# Patient Record
Sex: Female | Born: 1950 | Race: White | Hispanic: No | Marital: Married | Smoking: Never smoker
Health system: Southern US, Community
[De-identification: ages and names within clinical notes are randomized; demographics above are authoritative.]

## PROBLEM LIST (undated history)

## (undated) DIAGNOSIS — K649 Unspecified hemorrhoids: Secondary | ICD-10-CM

## (undated) DIAGNOSIS — R112 Nausea with vomiting, unspecified: Secondary | ICD-10-CM

## (undated) DIAGNOSIS — K0530 Chronic periodontitis, unspecified: Secondary | ICD-10-CM

## (undated) DIAGNOSIS — R42 Dizziness and giddiness: Secondary | ICD-10-CM

## (undated) DIAGNOSIS — M1990 Unspecified osteoarthritis, unspecified site: Secondary | ICD-10-CM

## (undated) DIAGNOSIS — L309 Dermatitis, unspecified: Secondary | ICD-10-CM

## (undated) DIAGNOSIS — E785 Hyperlipidemia, unspecified: Secondary | ICD-10-CM

## (undated) DIAGNOSIS — L409 Psoriasis, unspecified: Secondary | ICD-10-CM

## (undated) DIAGNOSIS — Z9889 Other specified postprocedural states: Secondary | ICD-10-CM

## (undated) HISTORY — DX: Unspecified hemorrhoids: K64.9

## (undated) HISTORY — PX: LIPOSUCTION: SHX10

## (undated) HISTORY — PX: COLONOSCOPY: SHX174

## (undated) HISTORY — PX: DILATION AND CURETTAGE OF UTERUS: SHX78

## (undated) HISTORY — PX: HEMORRHOID BANDING: SHX5850

## (undated) HISTORY — PX: OTHER SURGICAL HISTORY: SHX169

## (undated) HISTORY — PX: BREAST SURGERY: SHX581

## (undated) HISTORY — PX: KNEE ARTHROSCOPY W/ LASER: SHX1872

---

## 2007-10-30 ENCOUNTER — Ambulatory Visit (HOSPITAL_BASED_OUTPATIENT_CLINIC_OR_DEPARTMENT_OTHER): Admission: RE | Admit: 2007-10-30 | Discharge: 2007-10-30 | Payer: Self-pay | Admitting: Orthopedic Surgery

## 2008-05-20 ENCOUNTER — Encounter: Admission: RE | Admit: 2008-05-20 | Discharge: 2008-05-20 | Payer: Self-pay | Admitting: Family Medicine

## 2009-07-06 ENCOUNTER — Encounter: Admission: RE | Admit: 2009-07-06 | Discharge: 2009-07-06 | Payer: Self-pay | Admitting: Family Medicine

## 2010-08-01 ENCOUNTER — Encounter: Admission: RE | Admit: 2010-08-01 | Discharge: 2010-08-01 | Payer: Self-pay | Admitting: Family Medicine

## 2011-02-05 NOTE — Op Note (Signed)
NAMEJAZARIA, [name]:  [id number]   MEDICAL RECORD NO.:  [id number]          PATIENT TYPE:  AMB   LOCATION:  [facility]                         FACILITY:  [facility]   PHYSICIAN:  Ollen Gross, M.D.    DATE OF BIRTH:  May 26, 1951   DATE OF PROCEDURE:  10/30/2007  DATE OF DISCHARGE:                               OPERATIVE REPORT   PREOPERATIVE DIAGNOSIS:  Left knee medial meniscal tear.   POSTOPERATIVE DIAGNOSIS:  Left knee medial meniscal tear.   PROCEDURE:  Left knee arthroscopy with medial meniscal debridement.   SURGEON:  Ollen Gross, M.D.   ASSISTANT:  No assistant.   ANESTHESIA:  General.   BLOOD LOSS:  Minimal.   DRAINS:  None.   COMPLICATIONS:  None.   CONDITION:  Stable to recovery.   CLINICAL NOTE:  Lauren Bowman is a 60 year old female who has left knee pain  and mechanical symptoms.  She has had a previous arthroscopy with  meniscal debridement.  She has very similar symptoms now.  Exam and  history suggested recurrent medial tear confirmed by MRI.  She presents  now for arthroscopy with debridement.   PROCEDURE IN DETAIL:  After successful administration of general  anesthetic a tourniquet was placed high on her left thigh and left lower  extremity was prepped and draped in the usual sterile fashion.  Standard  superomedial and inferolateral incisions were made and flow cannula  passed, superomedial camera passed inferolateral.  Arthroscopic  visualization proceeds.  Undersurface of the patella and trochlea looked  normal.  The mediolateral gutters were visualized and there were no  loose bodies.  Flexion and valgus force was applied to the knee and the  medial compartment was entered.  The medial femoral condyle and tibial  plateau looked normal.  There was a little bit of fraying of the free  edge at the medial meniscus and then on the undersurface there was a  tear in the posterior horn.  It was not immediately visible but  examining the  undersurface tear there is a tear.  I then used a spinal  needle to localize the inferomedial portal.  A small incision made,  dilator placed and I was able to probe into the undersurface tear.  I  then debrided the meniscus back to a stable base using the baskets and a  4.2 mm shaver.  It is found to be stable.  The ArthroCare was then used  to seal off the edge of the debrided meniscus.  The rest of the medial  compartment looks normal.  The interchondral notch was visualized and  the ACL was normal.  The lateral compartment was entered and it looks  normal.  I again inspected the rest of the joint and I did not see any  other tears, chondral defects or loose bodies.  Arthroscopic equipment  was removed from the inferior portals which were  closed with interrupted 4-0 nylon.  Twenty mL of 0.25% Marcaine with epi  were injected through the inflow cannula and then that was removed and  that portal closed with nylon.  Bulky sterile  dressing was applied and  she was awakened and transported to recovery in stable condition.      Ollen Gross, M.D.  Electronically Signed     FA/MEDQ  D:  10/30/2007  T:  10/31/2007  Job:  [id number]

## 2011-07-01 ENCOUNTER — Other Ambulatory Visit: Payer: Self-pay | Admitting: Family Medicine

## 2011-07-01 DIAGNOSIS — Z1231 Encounter for screening mammogram for malignant neoplasm of breast: Secondary | ICD-10-CM

## 2011-08-05 ENCOUNTER — Ambulatory Visit: Payer: Self-pay

## 2011-08-12 ENCOUNTER — Ambulatory Visit
Admission: RE | Admit: 2011-08-12 | Discharge: 2011-08-12 | Disposition: A | Discharge status: Home / Self Care | Payer: PRIVATE HEALTH INSURANCE | Source: Ambulatory Visit | Attending: Family Medicine | Admitting: Family Medicine

## 2011-08-12 DIAGNOSIS — Z1231 Encounter for screening mammogram for malignant neoplasm of breast: Secondary | ICD-10-CM

## 2011-08-17 ENCOUNTER — Other Ambulatory Visit: Payer: Self-pay | Admitting: Orthopedic Surgery

## 2011-08-26 ENCOUNTER — Encounter (HOSPITAL_BASED_OUTPATIENT_CLINIC_OR_DEPARTMENT_OTHER): Payer: Self-pay | Admitting: *Deleted

## 2011-08-26 NOTE — Patient Instructions (Signed)
Pt instructed NPO p MN 12/5.  To [facility] 12/6 @ 1030. Needs ekg on arrival.  Will get labs from dr. Dewey"s office. 

## 2011-08-27 NOTE — Progress Notes (Signed)
Pt arrives at 1030. Npo after mn. Needs hg and ekg. RECEIVED CALL BACK FROM DREW PERKINS PA, ORDERS CHANGED ACCORDING TO PREVIOUS STANDING ORDERS.

## 2011-08-28 ENCOUNTER — Encounter (HOSPITAL_BASED_OUTPATIENT_CLINIC_OR_DEPARTMENT_OTHER): Payer: Self-pay | Admitting: Orthopedic Surgery

## 2011-08-28 NOTE — H&P (Signed)
  CC- Lauren Bowman is a 60 y.o. female who presents with left knee pain.  HPI- . Knee Pain: Patient presents with knee pain involving the  left knee. Onset of the symptoms was several months ago. Inciting event: injured while dancing. Current symptoms include giving out, pain located medially and swelling. Pain is aggravated by inactivity, rising after sitting, squatting and walking.  Patient has had prior knee problems. Evaluation to date: plain films: normal. Treatment to date: avoidance of offending activity, corticosteroid injection which was not very effective and rest.  Past Medical History  Diagnosis Date  . No pertinent past medical history   . Eczema   . Psoriasis     Past Surgical History  Procedure Date  . Knee arthroscopy w/ laser '09    w/ debridement  . Dilation and curettage of uterus     x2    Prior to Admission medications   Medication Sig Start Date End Date Taking? Authorizing Provider  atorvastatin (LIPITOR) 10 MG tablet Take 10 mg by mouth daily.     Yes Historical Provider, MD  celecoxib (CELEBREX) 200 MG capsule Take 200 mg by mouth 2 (two) times daily.     Yes Historical Provider, MD  glucosamine-chondroitin 500-400 MG tablet Take 1 tablet by mouth 3 (three) times daily.     Yes Historical Provider, MD  naproxen (NAPROSYN) 500 MG tablet Take 500 mg by mouth 2 (two) times daily with a meal.     Yes Historical Provider, MD  Vitamin D, Ergocalciferol, (DRISDOL) 50000 UNITS CAPS Take 50,000 Units by mouth.     Yes Historical Provider, MD  cholecalciferol (VITAMIN D) 400 UNITS TABS Inject 50,000 Units as directed once a week.     Historical Provider, MD   KNEE EXAM antalgic gait, soft tissue tenderness over medial joint line, negative drawer sign, collateral ligaments intact, negative Lachman sign, normal ipsilateral hip exam  Physical Examination: General appearance - alert, well appearing, and in no distress Mental status - alert, oriented to person, place, and  time Chest - clear to auscultation, no wheezes, rales or rhonchi, symmetric air entry Heart - normal rate, regular rhythm, normal S1, S2, no murmurs, rubs, clicks or gallops Abdomen - soft, nontender, nondistended, no masses or organomegaly Neurological - alert, oriented, normal speech, no focal findings or movement disorder noted   Asessment/Plan--- Left knee chondral defect- - Plan left knee arthroscopy with debridement. Procedure risks and potential comps discussed with patient who elects to proceed. Goals are decreased pain and increased function with a high likelihood of achieving both

## 2011-08-28 NOTE — Progress Notes (Signed)
Pt instructed NPO p MN 12/5.  To [facility] 12/6 @ 1030. Needs ekg on arrival.  Will get labs from dr. Chauncy Passy office.

## 2011-08-29 ENCOUNTER — Encounter (HOSPITAL_BASED_OUTPATIENT_CLINIC_OR_DEPARTMENT_OTHER): Payer: Self-pay | Admitting: Anesthesiology

## 2011-08-29 ENCOUNTER — Encounter (HOSPITAL_BASED_OUTPATIENT_CLINIC_OR_DEPARTMENT_OTHER): Payer: Self-pay | Admitting: *Deleted

## 2011-08-29 ENCOUNTER — Ambulatory Visit (HOSPITAL_BASED_OUTPATIENT_CLINIC_OR_DEPARTMENT_OTHER)
Admission: RE | Admit: 2011-08-29 | Discharge: 2011-08-29 | Disposition: A | Discharge status: Home / Self Care | Payer: PRIVATE HEALTH INSURANCE | Source: Ambulatory Visit | Attending: Orthopedic Surgery | Admitting: Orthopedic Surgery

## 2011-08-29 ENCOUNTER — Encounter (HOSPITAL_BASED_OUTPATIENT_CLINIC_OR_DEPARTMENT_OTHER): Payer: Self-pay | Admitting: Orthopedic Surgery

## 2011-08-29 ENCOUNTER — Ambulatory Visit (HOSPITAL_BASED_OUTPATIENT_CLINIC_OR_DEPARTMENT_OTHER): Payer: PRIVATE HEALTH INSURANCE | Admitting: Anesthesiology

## 2011-08-29 ENCOUNTER — Encounter (HOSPITAL_BASED_OUTPATIENT_CLINIC_OR_DEPARTMENT_OTHER)
Admission: RE | Disposition: A | Discharge status: Home / Self Care | Payer: Self-pay | Source: Ambulatory Visit | Attending: Orthopedic Surgery

## 2011-08-29 DIAGNOSIS — Z79899 Other long term (current) drug therapy: Secondary | ICD-10-CM | POA: Insufficient documentation

## 2011-08-29 DIAGNOSIS — M2390 Unspecified internal derangement of unspecified knee: Secondary | ICD-10-CM | POA: Insufficient documentation

## 2011-08-29 DIAGNOSIS — M25562 Pain in left knee: Secondary | ICD-10-CM

## 2011-08-29 DIAGNOSIS — M23329 Other meniscus derangements, posterior horn of medial meniscus, unspecified knee: Secondary | ICD-10-CM | POA: Insufficient documentation

## 2011-08-29 HISTORY — DX: Dermatitis, unspecified: L30.9

## 2011-08-29 HISTORY — PX: CHONDROPLASTY: SHX5177

## 2011-08-29 HISTORY — DX: Psoriasis, unspecified: L40.9

## 2011-08-29 SURGERY — ARTHROSCOPY, KNEE, WITH MEDIAL MENISCECTOMY
Anesthesia: General | Site: Knee | Laterality: Left | Wound class: Clean

## 2011-08-29 MED ORDER — DEXAMETHASONE SODIUM PHOSPHATE 4 MG/ML IJ SOLN
INTRAMUSCULAR | Status: DC | PRN
  Administered 2011-08-29: 4 mg via INTRAVENOUS

## 2011-08-29 MED ORDER — CHLORHEXIDINE GLUCONATE 4 % EX LIQD: 60.0000 mL | Freq: Once | CUTANEOUS | Status: DC

## 2011-08-29 MED ORDER — METOCLOPRAMIDE HCL 5 MG/ML IJ SOLN
INTRAMUSCULAR | Status: DC | PRN
  Administered 2011-08-29: 10 mg via INTRAVENOUS

## 2011-08-29 MED ORDER — KETOROLAC TROMETHAMINE 30 MG/ML IJ SOLN: 15.0000 mg | Freq: Once | INTRAMUSCULAR | Status: DC | PRN

## 2011-08-29 MED ORDER — CEFAZOLIN SODIUM 1-5 GM-% IV SOLN
INTRAVENOUS | Status: DC | PRN
  Administered 2011-08-29: 1 g via INTRAVENOUS

## 2011-08-29 MED ORDER — ONDANSETRON HCL 4 MG/2ML IJ SOLN
INTRAMUSCULAR | Status: DC | PRN
  Administered 2011-08-29: 4 mg via INTRAVENOUS

## 2011-08-29 MED ORDER — CEFAZOLIN SODIUM 1-5 GM-% IV SOLN: 1.0000 g | Freq: Once | INTRAVENOUS | Status: DC

## 2011-08-29 MED ORDER — OXYCODONE-ACETAMINOPHEN 5-325 MG PO TABS
1.0000 | ORAL_TABLET | Freq: Once | ORAL | Status: AC
  Administered 2011-08-29: 1 via ORAL

## 2011-08-29 MED ORDER — LIDOCAINE HCL (CARDIAC) 20 MG/ML IV SOLN
INTRAVENOUS | Status: DC | PRN
  Administered 2011-08-29: 100 mg via INTRAVENOUS

## 2011-08-29 MED ORDER — FENTANYL CITRATE 0.05 MG/ML IJ SOLN: 25.0000 ug | INTRAMUSCULAR | Status: DC | PRN

## 2011-08-29 MED ORDER — LACTATED RINGERS IV SOLN
INTRAVENOUS | Status: DC | PRN
  Administered 2011-08-29 (×2): via INTRAVENOUS

## 2011-08-29 MED ORDER — KETOROLAC TROMETHAMINE 30 MG/ML IJ SOLN
INTRAMUSCULAR | Status: DC | PRN
  Administered 2011-08-29: 30 mg via INTRAVENOUS

## 2011-08-29 MED ORDER — PROPOFOL 10 MG/ML IV EMUL
INTRAVENOUS | Status: DC | PRN
  Administered 2011-08-29: 180 mg via INTRAVENOUS

## 2011-08-29 MED ORDER — SODIUM CHLORIDE 0.9 % IR SOLN
Status: DC | PRN
  Administered 2011-08-29: 6000 mL

## 2011-08-29 MED ORDER — FENTANYL CITRATE 0.05 MG/ML IJ SOLN
INTRAMUSCULAR | Status: DC | PRN
  Administered 2011-08-29: 25 ug via INTRAVENOUS
  Administered 2011-08-29: 100 ug via INTRAVENOUS
  Administered 2011-08-29 (×3): 25 ug via INTRAVENOUS

## 2011-08-29 MED ORDER — BUPIVACAINE-EPINEPHRINE 0.25% -1:200000 IJ SOLN
INTRAMUSCULAR | Status: DC | PRN
  Administered 2011-08-29: 20 mL

## 2011-08-29 MED ORDER — METHOCARBAMOL 500 MG PO TABS
500.0000 mg | ORAL_TABLET | Freq: Four times a day (QID) | ORAL | Status: AC | PRN
  Administered 2011-08-29: 500 mg via ORAL

## 2011-08-29 MED ORDER — MIDAZOLAM HCL 5 MG/5ML IJ SOLN
INTRAMUSCULAR | Status: DC | PRN
  Administered 2011-08-29: 2 mg via INTRAVENOUS

## 2011-08-29 MED ORDER — OXYCODONE-ACETAMINOPHEN 5-325 MG PO TABS: 1.0000 | ORAL_TABLET | ORAL | Status: AC | PRN

## 2011-08-29 MED ORDER — LACTATED RINGERS IV SOLN
INTRAVENOUS | Status: DC
  Administered 2011-08-29: 12:00:00 via INTRAVENOUS

## 2011-08-29 MED ORDER — METHOCARBAMOL 500 MG PO TABS: 500.0000 mg | ORAL_TABLET | Freq: Four times a day (QID) | ORAL | Status: AC

## 2011-08-29 MED ORDER — DROPERIDOL 2.5 MG/ML IJ SOLN
INTRAMUSCULAR | Status: DC | PRN
  Administered 2011-08-29: 0.625 mg via INTRAVENOUS

## 2011-08-29 SURGICAL SUPPLY — 35 items
BANDAGE ELASTIC 6 VELCRO ST LF (GAUZE/BANDAGES/DRESSINGS) ×3 IMPLANT
BLADE 4.2CUDA (BLADE) ×3 IMPLANT
BLADE CUDA SHAVER 3.5 (BLADE) IMPLANT
BLADE CUTTER GATOR 3.5 (BLADE) IMPLANT
CANISTER SUCT LVC 12 LTR MEDI- (MISCELLANEOUS) ×3 IMPLANT
CANISTER SUCTION 2500CC (MISCELLANEOUS) IMPLANT
CLOTH BEACON ORANGE TIMEOUT ST (SAFETY) ×3 IMPLANT
DRAPE ARTHROSCOPY W/POUCH 114 (DRAPES) ×3 IMPLANT
DRSG EMULSION OIL 3X3 NADH (GAUZE/BANDAGES/DRESSINGS) ×3 IMPLANT
DRSG PAD ABDOMINAL 8X10 ST (GAUZE/BANDAGES/DRESSINGS) ×3 IMPLANT
DURAPREP 26ML APPLICATOR (WOUND CARE) ×3 IMPLANT
ELECT MENISCUS 165MM 90D (ELECTRODE) IMPLANT
ELECT REM PT RETURN 9FT ADLT (ELECTROSURGICAL)
ELECTRODE REM PT RTRN 9FT ADLT (ELECTROSURGICAL) IMPLANT
GLOVE BIO SURGEON STRL SZ8 (GLOVE) ×3 IMPLANT
GLOVE INDICATOR 6.5 STRL GRN (GLOVE) ×6 IMPLANT
GLOVE INDICATOR 8.0 STRL GRN (GLOVE) ×6 IMPLANT
GOWN PREVENTION PLUS LG XLONG (DISPOSABLE) ×6 IMPLANT
IV NS IRRIG 3000ML ARTHROMATIC (IV SOLUTION) ×6 IMPLANT
KNEE WRAP E Z 3 GEL PACK (MISCELLANEOUS) ×3 IMPLANT
PACK ARTHROSCOPY DSU (CUSTOM PROCEDURE TRAY) ×3 IMPLANT
PACK BASIN DAY SURGERY FS (CUSTOM PROCEDURE TRAY) ×3 IMPLANT
PADDING CAST ABS 4INX4YD NS (CAST SUPPLIES) ×1
PADDING CAST ABS COTTON 4X4 ST (CAST SUPPLIES) ×2 IMPLANT
PADDING CAST COTTON 6X4 STRL (CAST SUPPLIES) ×3 IMPLANT
PADDING WEBRIL 4 STERILE (GAUZE/BANDAGES/DRESSINGS) ×3 IMPLANT
PENCIL BUTTON HOLSTER BLD 10FT (ELECTRODE) IMPLANT
SET ARTHROSCOPY TUBING (MISCELLANEOUS) ×1
SET ARTHROSCOPY TUBING LN (MISCELLANEOUS) ×2 IMPLANT
SPONGE GAUZE 4X4 12PLY (GAUZE/BANDAGES/DRESSINGS) ×3 IMPLANT
SUT ETHILON 4 0 PS 2 18 (SUTURE) ×3 IMPLANT
TOWEL OR 17X24 6PK STRL BLUE (TOWEL DISPOSABLE) ×3 IMPLANT
WAND 30 DEG SABER W/CORD (SURGICAL WAND) IMPLANT
WAND 90 DEG TURBOVAC W/CORD (SURGICAL WAND) ×3 IMPLANT
WATER STERILE IRR 500ML POUR (IV SOLUTION) ×3 IMPLANT

## 2011-08-29 NOTE — Anesthesia Procedure Notes (Addendum)
Procedure Name: LMA Insertion Date/Time: 08/29/2011 1:14 PM Performed by: Iline Oven Pre-anesthesia Checklist: Patient identified, Emergency Drugs available, Suction available and Patient being monitored Patient Re-evaluated:Patient Re-evaluated prior to inductionOxygen Delivery Method: Circle System Utilized Preoxygenation: Pre-oxygenation with 100% oxygen Intubation Type: IV induction Ventilation: Mask ventilation without difficulty LMA: LMA inserted LMA Size: 4.0 Number of attempts: 1 Airway Equipment and Method: bite block Placement Confirmation: positive ETCO2 Tube secured with: Tape Dental Injury: Teeth and Oropharynx as per pre-operative assessment

## 2011-08-29 NOTE — Anesthesia Preprocedure Evaluation (Addendum)
Anesthesia Evaluation  Patient identified by MRN, date of birth, ID band Patient awake    Reviewed: Allergy & Precautions, H&P , NPO status , Patient's Chart, lab work & pertinent test results  Airway Mallampati: II TM Distance: >3 FB Neck ROM: Full  Mouth opening: Limited Mouth Opening  Dental No notable dental hx.    Pulmonary neg pulmonary ROS,  clear to auscultation  Pulmonary exam normal       Cardiovascular neg cardio ROS Regular Normal    Neuro/Psych Negative Neurological ROS  Negative Psych ROS   GI/Hepatic negative GI ROS, Neg liver ROS,   Endo/Other  Negative Endocrine ROS  Renal/GU negative Renal ROS  Genitourinary negative   Musculoskeletal negative musculoskeletal ROS (+)   Abdominal   Peds negative pediatric ROS (+)  Hematology negative hematology ROS (+)   Anesthesia Other Findings   Reproductive/Obstetrics negative OB ROS                          Anesthesia Physical Anesthesia Plan  ASA: I  Anesthesia Plan: General   Post-op Pain Management:    Induction:   Airway Management Planned: LMA  Additional Equipment:   Intra-op Plan:   Post-operative Plan:   Informed Consent: I have reviewed the patients History and Physical, chart, labs and discussed the procedure including the risks, benefits and alternatives for the proposed anesthesia with the patient or authorized representative who has indicated his/her understanding and acceptance.   Dental advisory given  Plan Discussed with: CRNA  Anesthesia Plan Comments:         Anesthesia Quick Evaluation

## 2011-08-29 NOTE — Transfer of Care (Signed)
Immediate Anesthesia Transfer of Care Note  Patient: Lauren Bowman  Procedure(s) Performed:  KNEE ARTHROSCOPY WITH MEDIAL MENISECTOMY; CHONDROPLASTY  Patient Location: PACU  Anesthesia Type: General  Level of Consciousness: awake, sedated, patient cooperative and responds to stimulation  Airway & Oxygen Therapy: Patient Spontanous Breathing and Patient connected to face mask oxygen  Post-op Assessment: Report given to PACU RN, Post -op Vital signs reviewed and stable and Patient moving all extremities  Post vital signs: Reviewed and stable  Complications: No apparent anesthesia complications

## 2011-08-29 NOTE — Op Note (Signed)
Preoperative diagnosis-  Left knee medial meniscal chondral defect  Postoperative diagnosis Left- knee medial meniscal tear   Plus Left chondral defect medially  Procedure- Left knee arthroscopy with medial  Meniscal debridement and chondroplasty  Surgeon- Gus Rankin. Fathima Bartl, MD  Anesthesia-General  EBL-  minimal Complications- None  Condition- PACU - hemodynamically stable.  Brief clinical note- -Lauren Bowman is a 60 y.o.  female with a long history of left knee pain and mechanical symptoms. She has had previous arthroscopy and has significant pain with weight bearing and has failed injections of cortisone and viscosupplements. X-rays show no evidence of bone on bone OA. The patient presents now for arthroscopy and debridement due to failure of non-operative management.  Procedure in detail -       After successful administration of General anesthetic, a tourmiquet is placed high on the Left  thigh and the Left lower extremity is prepped and draped in the usual sterile fashion. Time out is performed by the surgical team. Standard superomedial and inferolateral portal sites are marked and incisions made with an 11 blade. The inflow cannula is passed through the superomedial portal and camera through the inferolateral portal and inflow is initiated. Arthroscopic visualization proceeds.      The undersurface of the patella and trochlea are visualized and they appear normal without evidence of cartilage lesions. The medial and lateral gutters are visualized and there are no loose bodies. Flexion and valgus force is applied to the knee and the medial compartment is entered. A spinal needle is passed into the joint through the site marked for the inferomedial portal. A small incision is made and the dilator passed into the joint. The findings for the medial compartment are bone on bone changes through over 50% of the medial compartment with a degenerative posterior horn medial meniscal tear and  unstable articular cartilage on the medial femoral condyle. The tear is debrided to a stable base with baskets and a shaver and sealed off with the Arthrocare. The shaver is used to debride the unstable cartilage to a stable bony base with stable edges. It is probed and found to be stable.    The intercondylar notch is visualized and the ACL appears normal. The lateral compartment is entered and the findings are normal lateral compartment .      The joint is again inspected and there are no other tears, defects or loose bodies identified. The arthroscopic equipment is then removed from the inferior portals which are closed with interrupted 4-0 nylon. 20 ml of .25% Marcaine with epinephrine are injected through the inflow cannula and the cannula is then removed and the portal closed with nylon. The incisions are cleaned and dried and a bulky sterile dressing is applied. The patient is then awakened and transported to recovery in stable condition.   08/29/2011, 1:45 PM

## 2011-08-29 NOTE — Interval H&P Note (Signed)
History and Physical Interval Note:  08/29/2011 1:01 PM  Lauren Bowman  has presented today for surgery, with the diagnosis of left knee chondral defect  The various methods of treatment have been discussed with the patient and family. After consideration of risks, benefits and other options for treatment, the patient has consented to  Procedure(s): ARTHROSCOPY KNEE as a surgical intervention .  The patients' history has been reviewed, patient examined, no change in status, stable for surgery.  I have reviewed the patients' chart and labs.  Questions were answered to the patient's satisfaction.     Loanne Drilling

## 2011-08-29 NOTE — Anesthesia Postprocedure Evaluation (Signed)
  Anesthesia Post-op Note  Patient: Lauren Bowman  Procedure(s) Performed:  KNEE ARTHROSCOPY WITH MEDIAL MENISECTOMY; CHONDROPLASTY  Patient Location: PACU  Anesthesia Type: General  Level of Consciousness: awake and alert   Airway and Oxygen Therapy: Patient Spontanous Breathing  Post-op Pain: mild  Post-op Assessment: Post-op Vital signs reviewed, Patient's Cardiovascular Status Stable, Respiratory Function Stable, Patent Airway and No signs of Nausea or vomiting  Post-op Vital Signs: stable  Complications: No apparent anesthesia complications

## 2011-09-02 ENCOUNTER — Encounter (HOSPITAL_BASED_OUTPATIENT_CLINIC_OR_DEPARTMENT_OTHER): Payer: Self-pay | Admitting: Orthopedic Surgery

## 2011-11-13 NOTE — Progress Notes (Signed)
H&P Dictated 11/13/2011/ Dictation # [id number]

## 2011-11-14 ENCOUNTER — Encounter (HOSPITAL_COMMUNITY): Payer: Self-pay

## 2011-11-14 ENCOUNTER — Encounter (HOSPITAL_COMMUNITY)
Admission: RE | Admit: 2011-11-14 | Discharge: 2011-11-14 | Disposition: A | Discharge status: Home / Self Care | Payer: PRIVATE HEALTH INSURANCE | Source: Ambulatory Visit | Attending: Orthopedic Surgery | Admitting: Orthopedic Surgery

## 2011-11-14 ENCOUNTER — Encounter (HOSPITAL_COMMUNITY): Payer: Self-pay | Admitting: Pharmacy Technician

## 2011-11-14 HISTORY — DX: Other specified postprocedural states: Z98.890

## 2011-11-14 HISTORY — DX: Hyperlipidemia, unspecified: E78.5

## 2011-11-14 HISTORY — DX: Unspecified osteoarthritis, unspecified site: M19.90

## 2011-11-14 HISTORY — DX: Chronic periodontitis, unspecified: K05.30

## 2011-11-14 HISTORY — DX: Dizziness and giddiness: R42

## 2011-11-14 HISTORY — DX: Other specified postprocedural states: R11.2

## 2011-11-14 LAB — URINALYSIS, ROUTINE W REFLEX MICROSCOPIC
Glucose, UA: NEGATIVE mg/dL
Ketones, ur: NEGATIVE mg/dL
Leukocytes, UA: NEGATIVE
Nitrite: NEGATIVE
Specific Gravity, Urine: 1.028 (ref 1.005–1.030)
pH: 6 (ref 5.0–8.0)

## 2011-11-14 LAB — CBC
Platelets: 253 10*3/uL (ref 150–400)
RBC: 4.55 MIL/uL (ref 3.87–5.11)
RDW: 13.6 % (ref 11.5–15.5)
WBC: 7.5 10*3/uL (ref 4.0–10.5)

## 2011-11-14 LAB — DIFFERENTIAL
Basophils Relative: 1 % (ref 0–1)
Lymphs Abs: 2.8 10*3/uL (ref 0.7–4.0)
Monocytes Absolute: 0.5 10*3/uL (ref 0.1–1.0)
Monocytes Relative: 7 % (ref 3–12)
Neutro Abs: 3.8 10*3/uL (ref 1.7–7.7)
Neutrophils Relative %: 50 % (ref 43–77)

## 2011-11-14 LAB — BASIC METABOLIC PANEL
Calcium: 9.2 mg/dL (ref 8.4–10.5)
GFR calc Af Amer: >90 mL/min (ref >90)
GFR calc non Af Amer: >90 mL/min (ref >90)
Sodium: 140 mEq/L (ref 135–145)

## 2011-11-14 LAB — SURGICAL PCR SCREEN
MRSA, PCR: NEGATIVE
Staphylococcus aureus: NEGATIVE

## 2011-11-14 LAB — PROTIME-INR: INR: 0.86 (ref 0.00–1.49)

## 2011-11-14 LAB — APTT: aPTT: 29 seconds (ref 24–37)

## 2011-11-14 MED ORDER — CHLORHEXIDINE GLUCONATE 4 % EX LIQD
60.0000 mL | Freq: Once | CUTANEOUS | Status: DC
  Filled 2011-11-14: qty 60

## 2011-11-14 NOTE — Pre-Procedure Instructions (Signed)
Chest x ray and ekg not done today at PST visit as did not meet criteria

## 2011-11-14 NOTE — H&P (Signed)
NAMECADINCE, HILSCHER NO.:  [id number]  MEDICAL RECORD NO.:  [id number]  LOCATION:                               FACILITY:  [facility]  PHYSICIAN:  Madlyn Frankel. [name], M.D.  DATE OF BIRTH:  15-Dec-1950  DATE OF ADMISSION:  11/19/2011 DATE OF DISCHARGE:                             HISTORY & PHYSICAL   DATE OF SURGERY:  November 19, 2011.  ADMITTING DIAGNOSIS:  Medial compartment arthritis, left knee.  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  At this time, she is now scheduled for unicompartmental arthroplasty of her left knee.  The surgery, risks, benefits, and aftercare were discussed in detail with the patient, questions invited and answered.  Note that she is not a candidate for tranexamic acid or dexamethasone and will not receive that at surgery. She is planning on going home after surgery.  Her medical doctor is Dr. Duanne Guess.  Note that she is on Stelara for her psoriasis.  We did call over and speak to her dermatologist's nurse and in-charge of the Stelara and she is okay to proceed with surgery, though as discussed with the patient, any of the biologics do put her at slight increased risk of infection, and she will need to watch her wound closely after surgery. She is given her home medications of aspirin, iron, MiraLax, Colace, and Robaxin.  DRUG ALLERGIES:  Nitrogen mustard.  CURRENT MEDICATIONS: 1. Cosamin ASU. 2. Celebrex 200 mg daily. 3. Stelara 1 shot every 3 months. 4. Nitrofurantoin. 5. Pyridium p.r.n.  SERIOUS MEDICAL ILLNESSES:  Hypercholesterolemia, psoriasis, history of basal cell carcinoma on her face.  PREVIOUS SURGERIES:  Left knee scope x3, childbirth, and D and C x3.  FAMILY HISTORY:  Positive for lymphoma, CVA, diabetes, coronary artery disease, and  testicular cancer.  SOCIAL HISTORY:  The patient is married.  She does not smoke and drinks occasionally.  REVIEW OF SYSTEMS:  CENTRAL NERVOUS SYSTEM:  Negative for headache, blurred vision, or dizziness.  PULMONARY:  Negative shortness of breath, PND, or orthopnea.  CARDIOVASCULAR:  Negative chest pain or palpitation. GASTROINTESTINAL:  Negative for ulcers or hepatitis.  GENITOURINARY: Positive for history of intermittent UTI.  MUSCULOSKELETAL:  Positive as in HPI.  PHYSICAL EXAMINATION:  VITAL SIGNS:  Blood pressure 147/70, pulse 72 and regular, respirations 14. HEENT:  Head, normocephalic.  Nose, patent.  Ears, patent.  Pupils equal, round, and reactive to light.  Throat, without injection. NECK:  Supple without adenopathy.  Carotids 2+ without bruit. CHEST:  Clear to auscultation.  No rales or rhonchi.  Respirations 14. HEART:  Regular rate and rhythm at 72 beats per minute without murmur. ABDOMEN:  Soft.  Active bowel sounds.  No masses or organomegaly. NEUROLOGIC:  The patient is alert and  oriented to time, place, and person.  Cranial nerves II through XII grossly intact. EXTREMITIES:  Left knee with full extension, further flexion to 130 degrees.  Tenderness in the medial joint line.  Neurovascular status is intact.  Dorsalis pedis, posterior tibialis pulses are 2+.  IMPRESSION:  Medial compartment osteoarthritis left knee.  PLAN:  Unicompartmental arthroplasty, left knee.     Jaquelyn Bitter. Geraldine Sandberg, P.A.   ______________________________ Madlyn Frankel [name], M.D.    SJC/MEDQ  D:  11/13/2011  T:  11/14/2011  Job:  [id number]

## 2011-11-14 NOTE — Patient Instructions (Addendum)
20 [name]  11/14/2011   Your procedure is scheduled on:  11/19/11  Surgery 1030-1210   TUESDAY  Report to [facility] at   0800    AM.  Call this number if you have problems the morning of surgery: [phone]     Or PST   [id number]  [facility]   Remember:   Do not eat food:After Midnight.   Monday NIGHT  May have clear liquids until midnight: Monday NIGHT  Clear liquids include soda, tea, black coffee, apple or grape juice, broth.  Take these medicines the morning of surgery with A SIP OF WATER:none   Do not wear jewelry, make-up or nail polish.  Do not wear lotions, powders, or perfumes. You may wear deodorant.  Do not shave 48 hours prior to surgery.  Do not bring valuables to the hospital.  Contacts, dentures or bridgework may not be worn into surgery.  Leave suitcase in the car. After surgery it may be brought to your room.  For patients admitted to the hospital, checkout time is 11:00 AM the day of discharge.   Patients discharged the day of surgery will not be allowed to drive home.  Name and phone number of your driver:      Husband                                                                Special Instructions: CHG Shower Use Special Wash: 1/2 bottle night before surgery and 1/2 bottle morning of surgery. REGULAR SOAP FACE AND PRIVATES              LADIES- NO SHAVING 48 HOURS BEFORE USING BETASEPT SOAP.                   Please read over the following fact sheets that you were given: MRSA Information

## 2011-11-19 ENCOUNTER — Encounter (HOSPITAL_COMMUNITY)
Admission: RE | Disposition: A | Discharge status: Home / Self Care | Payer: Self-pay | Source: Ambulatory Visit | Attending: Orthopedic Surgery

## 2011-11-19 ENCOUNTER — Encounter (HOSPITAL_COMMUNITY): Payer: Self-pay | Admitting: Anesthesiology

## 2011-11-19 ENCOUNTER — Ambulatory Visit (HOSPITAL_COMMUNITY)
Admission: RE | Admit: 2011-11-19 | Discharge: 2011-11-20 | Disposition: A | Discharge status: Home / Self Care | Payer: PRIVATE HEALTH INSURANCE | Source: Ambulatory Visit | Attending: Orthopedic Surgery | Admitting: Orthopedic Surgery

## 2011-11-19 ENCOUNTER — Encounter (HOSPITAL_COMMUNITY): Payer: Self-pay | Admitting: *Deleted

## 2011-11-19 ENCOUNTER — Inpatient Hospital Stay (HOSPITAL_COMMUNITY): Payer: PRIVATE HEALTH INSURANCE | Admitting: Anesthesiology

## 2011-11-19 DIAGNOSIS — L408 Other psoriasis: Secondary | ICD-10-CM | POA: Insufficient documentation

## 2011-11-19 DIAGNOSIS — Z85828 Personal history of other malignant neoplasm of skin: Secondary | ICD-10-CM | POA: Insufficient documentation

## 2011-11-19 DIAGNOSIS — E7800 Pure hypercholesterolemia, unspecified: Secondary | ICD-10-CM | POA: Insufficient documentation

## 2011-11-19 DIAGNOSIS — Z01812 Encounter for preprocedural laboratory examination: Secondary | ICD-10-CM | POA: Insufficient documentation

## 2011-11-19 DIAGNOSIS — Z96652 Presence of left artificial knee joint: Secondary | ICD-10-CM

## 2011-11-19 DIAGNOSIS — M1710 Unilateral primary osteoarthritis, unspecified knee: Principal | ICD-10-CM | POA: Insufficient documentation

## 2011-11-19 DIAGNOSIS — Z79899 Other long term (current) drug therapy: Secondary | ICD-10-CM | POA: Insufficient documentation

## 2011-11-19 HISTORY — PX: PARTIAL KNEE ARTHROPLASTY: SHX2174

## 2011-11-19 LAB — TYPE AND SCREEN
ABO/RH(D): A NEG
Antibody Screen: NEGATIVE

## 2011-11-19 SURGERY — ARTHROPLASTY, KNEE, UNICOMPARTMENTAL
Anesthesia: Spinal | Site: Knee | Laterality: Left | Wound class: Clean

## 2011-11-19 MED ORDER — POLYETHYLENE GLYCOL 3350 17 G PO PACK
17.0000 g | PACK | Freq: Two times a day (BID) | ORAL | Status: DC
  Filled 2011-11-19 (×3): qty 1

## 2011-11-19 MED ORDER — RIVAROXABAN 10 MG PO TABS
10.0000 mg | ORAL_TABLET | ORAL | Status: DC
  Administered 2011-11-20: 10 mg via ORAL
  Filled 2011-11-19: qty 1

## 2011-11-19 MED ORDER — METOCLOPRAMIDE HCL 5 MG/ML IJ SOLN: 5.0000 mg | Freq: Three times a day (TID) | INTRAMUSCULAR | Status: DC | PRN

## 2011-11-19 MED ORDER — METHOCARBAMOL 500 MG PO TABS
500.0000 mg | ORAL_TABLET | Freq: Four times a day (QID) | ORAL | Status: DC | PRN
  Administered 2011-11-20: 500 mg via ORAL
  Filled 2011-11-19: qty 1

## 2011-11-19 MED ORDER — CEFAZOLIN SODIUM 1-5 GM-% IV SOLN
1.0000 g | INTRAVENOUS | Status: AC
  Administered 2011-11-19: 1 g via INTRAVENOUS

## 2011-11-19 MED ORDER — HYDROMORPHONE HCL PF 1 MG/ML IJ SOLN
INTRAMUSCULAR | Status: AC
  Administered 2011-11-19: 0.5 mg via INTRAVENOUS
  Filled 2011-11-19: qty 1

## 2011-11-19 MED ORDER — KETOROLAC TROMETHAMINE 30 MG/ML IJ SOLN
INTRAMUSCULAR | Status: DC | PRN
  Administered 2011-11-19: 30 mg via INTRAMUSCULAR

## 2011-11-19 MED ORDER — ACETAMINOPHEN 325 MG PO TABS: 650.0000 mg | ORAL_TABLET | Freq: Four times a day (QID) | ORAL | Status: DC | PRN

## 2011-11-19 MED ORDER — PHENOL 1.4 % MT LIQD
1.0000 | OROMUCOSAL | Status: DC | PRN
  Filled 2011-11-19: qty 177

## 2011-11-19 MED ORDER — ONDANSETRON HCL 4 MG PO TABS: 4.0000 mg | ORAL_TABLET | Freq: Four times a day (QID) | ORAL | Status: DC | PRN

## 2011-11-19 MED ORDER — ONDANSETRON HCL 4 MG/2ML IJ SOLN: 4.0000 mg | Freq: Four times a day (QID) | INTRAMUSCULAR | Status: DC | PRN

## 2011-11-19 MED ORDER — FLEET ENEMA 7-19 GM/118ML RE ENEM: 1.0000 | ENEMA | Freq: Once | RECTAL | Status: AC | PRN

## 2011-11-19 MED ORDER — DOCUSATE SODIUM 100 MG PO CAPS
100.0000 mg | ORAL_CAPSULE | Freq: Two times a day (BID) | ORAL | Status: DC
  Filled 2011-11-19 (×3): qty 1

## 2011-11-19 MED ORDER — FENTANYL CITRATE 0.05 MG/ML IJ SOLN: 25.0000 ug | INTRAMUSCULAR | Status: DC | PRN

## 2011-11-19 MED ORDER — DIPHENHYDRAMINE HCL 25 MG PO CAPS: 25.0000 mg | ORAL_CAPSULE | Freq: Four times a day (QID) | ORAL | Status: DC | PRN

## 2011-11-19 MED ORDER — BUPIVACAINE-EPINEPHRINE 0.25% -1:200000 IJ SOLN
INTRAMUSCULAR | Status: DC | PRN
  Administered 2011-11-19: 30 mL

## 2011-11-19 MED ORDER — SODIUM CHLORIDE 0.9 % IV SOLN
INTRAVENOUS | Status: DC
  Administered 2011-11-19: 18:00:00 via INTRAVENOUS
  Filled 2011-11-19 (×5): qty 1000

## 2011-11-19 MED ORDER — DEXAMETHASONE SODIUM PHOSPHATE 10 MG/ML IJ SOLN
INTRAMUSCULAR | Status: DC | PRN
  Administered 2011-11-19: 10 mg via INTRAVENOUS

## 2011-11-19 MED ORDER — CEFAZOLIN SODIUM 1-5 GM-% IV SOLN
1.0000 g | Freq: Four times a day (QID) | INTRAVENOUS | Status: AC
  Administered 2011-11-19 – 2011-11-20 (×3): 1 g via INTRAVENOUS
  Filled 2011-11-19 (×3): qty 50

## 2011-11-19 MED ORDER — MENTHOL 3 MG MT LOZG
1.0000 | LOZENGE | OROMUCOSAL | Status: DC | PRN
  Filled 2011-11-19: qty 9

## 2011-11-19 MED ORDER — MIDAZOLAM HCL 5 MG/5ML IJ SOLN
INTRAMUSCULAR | Status: DC | PRN
  Administered 2011-11-19 (×2): 1 mg via INTRAVENOUS

## 2011-11-19 MED ORDER — METHOCARBAMOL 100 MG/ML IJ SOLN
500.0000 mg | Freq: Four times a day (QID) | INTRAVENOUS | Status: DC | PRN
  Filled 2011-11-19: qty 5

## 2011-11-19 MED ORDER — FERROUS SULFATE 325 (65 FE) MG PO TABS
325.0000 mg | ORAL_TABLET | Freq: Three times a day (TID) | ORAL | Status: DC
  Administered 2011-11-20: 325 mg via ORAL
  Filled 2011-11-19 (×4): qty 1

## 2011-11-19 MED ORDER — HYDROMORPHONE HCL PF 1 MG/ML IJ SOLN
0.5000 mg | INTRAMUSCULAR | Status: DC | PRN
  Administered 2011-11-19: 0.5 mg via INTRAVENOUS
  Administered 2011-11-20: 2 mg via INTRAVENOUS
  Filled 2011-11-19 (×2): qty 1

## 2011-11-19 MED ORDER — PROPOFOL 10 MG/ML IV EMUL
INTRAVENOUS | Status: DC | PRN
  Administered 2011-11-19: 50 ug/kg/min via INTRAVENOUS

## 2011-11-19 MED ORDER — BUPIVACAINE IN DEXTROSE 0.75-8.25 % IT SOLN
INTRATHECAL | Status: DC | PRN
  Administered 2011-11-19: 2 mL via INTRATHECAL

## 2011-11-19 MED ORDER — HYDROCODONE-ACETAMINOPHEN 7.5-325 MG PO TABS
1.0000 | ORAL_TABLET | ORAL | Status: DC
  Administered 2011-11-19 (×2): 1 via ORAL
  Administered 2011-11-19: 2 via ORAL
  Administered 2011-11-20 (×2): 1 via ORAL
  Administered 2011-11-20: 2 via ORAL
  Filled 2011-11-19 (×2): qty 1
  Filled 2011-11-19: qty 2
  Filled 2011-11-19: qty 1
  Filled 2011-11-19: qty 2
  Filled 2011-11-19 (×2): qty 1

## 2011-11-19 MED ORDER — ACETAMINOPHEN 10 MG/ML IV SOLN
INTRAVENOUS | Status: DC | PRN
  Administered 2011-11-19: 1000 mg via INTRAVENOUS

## 2011-11-19 MED ORDER — ACETAMINOPHEN 650 MG RE SUPP: 650.0000 mg | Freq: Four times a day (QID) | RECTAL | Status: DC | PRN

## 2011-11-19 MED ORDER — EPHEDRINE SULFATE 50 MG/ML IJ SOLN
INTRAMUSCULAR | Status: DC | PRN
  Administered 2011-11-19: 5 mg via INTRAVENOUS

## 2011-11-19 MED ORDER — LIDOCAINE HCL (CARDIAC) 20 MG/ML IV SOLN
INTRAVENOUS | Status: DC | PRN
  Administered 2011-11-19: 100 mg via INTRAVENOUS

## 2011-11-19 MED ORDER — LACTATED RINGERS IV SOLN
INTRAVENOUS | Status: DC
  Administered 2011-11-19: 1000 mL via INTRAVENOUS

## 2011-11-19 MED ORDER — METOCLOPRAMIDE HCL 10 MG PO TABS: 5.0000 mg | ORAL_TABLET | Freq: Three times a day (TID) | ORAL | Status: DC | PRN

## 2011-11-19 MED ORDER — ALUM & MAG HYDROXIDE-SIMETH 200-200-20 MG/5ML PO SUSP: 30.0000 mL | ORAL | Status: DC | PRN

## 2011-11-19 MED ORDER — PROMETHAZINE HCL 25 MG/ML IJ SOLN: 6.2500 mg | INTRAMUSCULAR | Status: DC | PRN

## 2011-11-19 MED ORDER — ZOLPIDEM TARTRATE 5 MG PO TABS
5.0000 mg | ORAL_TABLET | Freq: Every evening | ORAL | Status: DC | PRN
  Administered 2011-11-20: 5 mg via ORAL
  Filled 2011-11-19: qty 1

## 2011-11-19 MED ORDER — PHENAZOPYRIDINE HCL 200 MG PO TABS
200.0000 mg | ORAL_TABLET | Freq: Three times a day (TID) | ORAL | Status: DC | PRN
  Filled 2011-11-19: qty 1

## 2011-11-19 MED ORDER — BISACODYL 5 MG PO TBEC: 5.0000 mg | DELAYED_RELEASE_TABLET | Freq: Every day | ORAL | Status: DC | PRN

## 2011-11-19 MED ORDER — SCOPOLAMINE 1 MG/3DAYS TD PT72
1.0000 | MEDICATED_PATCH | TRANSDERMAL | Status: DC
  Administered 2011-11-19: 1.5 mg via TRANSDERMAL
  Filled 2011-11-19: qty 1

## 2011-11-19 MED ORDER — DROPERIDOL 2.5 MG/ML IJ SOLN
INTRAMUSCULAR | Status: DC | PRN
  Administered 2011-11-19: 0.625 mg via INTRAVENOUS

## 2011-11-19 SURGICAL SUPPLY — 46 items
BAG ZIPLOCK 12X15 (MISCELLANEOUS) ×2 IMPLANT
BANDAGE ELASTIC 6 VELCRO ST LF (GAUZE/BANDAGES/DRESSINGS) ×2 IMPLANT
BANDAGE ESMARK 6X9 LF (GAUZE/BANDAGES/DRESSINGS) ×1 IMPLANT
BLADE SAW RECIPROCATING 77.5 (BLADE) ×2 IMPLANT
BLADE SAW SGTL 13.0X1.19X90.0M (BLADE) ×2 IMPLANT
BNDG ESMARK 6X9 LF (GAUZE/BANDAGES/DRESSINGS) ×2
BOWL SMART MIX CTS (DISPOSABLE) ×2 IMPLANT
CEMENT HV SMART SET (Cement) ×2 IMPLANT
CLOTH BEACON ORANGE TIMEOUT ST (SAFETY) ×2 IMPLANT
COVER SURGICAL LIGHT HANDLE (MISCELLANEOUS) ×2 IMPLANT
CUFF TOURN SGL QUICK 34 (TOURNIQUET CUFF) ×1
CUFF TRNQT CYL 34X4X40X1 (TOURNIQUET CUFF) ×1 IMPLANT
DERMABOND ADVANCED (GAUZE/BANDAGES/DRESSINGS) ×1
DERMABOND ADVANCED .7 DNX12 (GAUZE/BANDAGES/DRESSINGS) ×1 IMPLANT
DRAPE EXTREMITY T 121X128X90 (DRAPE) ×2 IMPLANT
DRAPE POUCH INSTRU U-SHP 10X18 (DRAPES) ×2 IMPLANT
DRSG AQUACEL AG ADV 3.5X 6 (GAUZE/BANDAGES/DRESSINGS) ×2 IMPLANT
DRSG TEGADERM 4X4.75 (GAUZE/BANDAGES/DRESSINGS) ×2 IMPLANT
DURAPREP 26ML APPLICATOR (WOUND CARE) ×2 IMPLANT
ELECT REM PT RETURN 9FT ADLT (ELECTROSURGICAL) ×2
ELECTRODE REM PT RTRN 9FT ADLT (ELECTROSURGICAL) ×1 IMPLANT
EVACUATOR 1/8 PVC DRAIN (DRAIN) ×2 IMPLANT
FACESHIELD LNG OPTICON STERILE (SAFETY) ×8 IMPLANT
GAUZE SPONGE 2X2 8PLY STRL LF (GAUZE/BANDAGES/DRESSINGS) ×1 IMPLANT
GLOVE BIOGEL PI IND STRL 7.5 (GLOVE) ×1 IMPLANT
GLOVE BIOGEL PI IND STRL 8 (GLOVE) ×2 IMPLANT
GLOVE BIOGEL PI INDICATOR 7.5 (GLOVE) ×1
GLOVE BIOGEL PI INDICATOR 8 (GLOVE) ×2
GLOVE ORTHO TXT STRL SZ7.5 (GLOVE) ×4 IMPLANT
GOWN BRE IMP PREV XXLGXLNG (GOWN DISPOSABLE) ×4 IMPLANT
GOWN STRL NON-REIN LRG LVL3 (GOWN DISPOSABLE) ×2 IMPLANT
KIT BASIN OR (CUSTOM PROCEDURE TRAY) ×2 IMPLANT
MANIFOLD NEPTUNE II (INSTRUMENTS) ×2 IMPLANT
NDL SAFETY ECLIPSE 18X1.5 (NEEDLE) ×1 IMPLANT
NEEDLE HYPO 18GX1.5 SHARP (NEEDLE) ×1
PACK TOTAL JOINT (CUSTOM PROCEDURE TRAY) ×2 IMPLANT
POSITIONER SURGICAL ARM (MISCELLANEOUS) ×2 IMPLANT
SPONGE GAUZE 2X2 STER 10/PKG (GAUZE/BANDAGES/DRESSINGS) ×1
SUCTION FRAZIER TIP 10 FR DISP (SUCTIONS) ×2 IMPLANT
SUT MNCRL AB 4-0 PS2 18 (SUTURE) ×2 IMPLANT
SUT VIC AB 1 CT1 36 (SUTURE) ×4 IMPLANT
SUT VIC AB 2-0 CT1 27 (SUTURE) ×2
SUT VIC AB 2-0 CT1 TAPERPNT 27 (SUTURE) ×2 IMPLANT
SYR 50ML LL SCALE MARK (SYRINGE) ×2 IMPLANT
TOWEL OR 17X26 10 PK STRL BLUE (TOWEL DISPOSABLE) ×4 IMPLANT
TRAY FOLEY CATH 14FRSI W/METER (CATHETERS) ×2 IMPLANT

## 2011-11-19 NOTE — Interval H&P Note (Signed)
History and Physical Interval Note:  11/19/2011 8:43 AM  Lauren Bowman  has presented today for surgery, with the diagnosis of left knee medial compartment osteoarthritis   The various methods of treatment have been discussed with the patient and family. After consideration of risks, benefits and other options for treatment, the patient has consented to  Procedure(s) (LRB): LEFT UNICOMPARTMENTAL KNEE (Left) as a surgical intervention .  The patients' history has been reviewed, patient examined, no change in status, stable for surgery.  I have reviewed the patients' chart and labs.  Questions were answered to the patient's satisfaction.     Shelda Pal

## 2011-11-19 NOTE — Anesthesia Procedure Notes (Signed)
Spinal  Patient location during procedure: OR Staffing Anesthesiologist: Azell Der Performed by: anesthesiologist  Preanesthetic Checklist Completed: patient identified, site marked, surgical consent, pre-op evaluation, timeout performed, IV checked, risks and benefits discussed and monitors and equipment checked Spinal Block Patient position: sitting Prep: Betadine Patient monitoring: heart rate, continuous pulse ox and blood pressure Approach: midline Location: L3-4 Injection technique: single-shot Needle Needle type: Sprotte  Needle gauge: 24 G Needle length: 9 cm Additional Notes Expiration date of kit checked and confirmed. Patient tolerated procedure well, without complications.

## 2011-11-19 NOTE — Anesthesia Postprocedure Evaluation (Signed)
  Anesthesia Post-op Note  Patient: Lauren Bowman  Procedure(s) Performed: Procedure(s) (LRB): UNICOMPARTMENTAL KNEE (Left)  Patient Location: PACU  Anesthesia Type: Spinal  Level of Consciousness: awake and alert   Airway and Oxygen Therapy: Patient Spontanous Breathing  Post-op Pain: mild  Post-op Assessment: Post-op Vital signs reviewed, Patient's Cardiovascular Status Stable, Respiratory Function Stable, Patent Airway and No signs of Nausea or vomiting  Post-op Vital Signs: stable  Complications: No apparent anesthesia complications. Moving both feet. No complaints.

## 2011-11-19 NOTE — Transfer of Care (Signed)
Immediate Anesthesia Transfer of Care Note  Patient: Lauren Bowman  Procedure(s) Performed: Procedure(s) (LRB): UNICOMPARTMENTAL KNEE (Left)  Patient Location: PACU  Anesthesia Type: Spinal  Level of Consciousness: sedated, patient cooperative and responds to stimulaton  Airway & Oxygen Therapy: Patient Spontanous Breathing and Patient connected to face mask oxgen  Post-op Assessment: Report given to PACU RN and Post -op Vital signs reviewed and stable  Post vital signs: Reviewed and stable  Complications: No apparent anesthesia complications

## 2011-11-19 NOTE — Op Note (Signed)
NAME: Constance Whittle    MEDICAL RECORD NO.: [id number]   FACILITY: [facility]   DATE OF BIRTH: [birth year]  PHYSICIAN: Madlyn Frankel. [name], M.D.    DATE OF PROCEDURE: 11/19/2011    OPERATIVE REPORT   PREOPERATIVE DIAGNOSIS: Left knee medial compartment osteoarthritis.   POSTOPERATIVE DIAGNOSIS: Left knee medial compartment osteoarthritis.  PROCEDURE: Left partial knee replacement utilizing Biomet Oxford knee  component, size small femur, a AA left medial size tibial tray with a 4 mm insert.   SURGEON: Madlyn Frankel. [name], M.D.   ASSISTANT: Lanney Gins, PAC.  Please note that Mr. Carmon Sails was present for the entirety of the case,  utilized for preoperative positioning, perioperative retractor  management, general facilitation of the case and primary wound closure.   ANESTHESIA: Spinal.   SPECIMENS: None.   COMPLICATIONS: None.  DRAINS: 1 medium HV   TOURNIQUET TIME: 60 minutes at 250 mmHg.   INDICATIONS FOR PROCEDURE: The patient is a 2 patient of mine who presented for evaluation of left medial knee pain.  They presented with primary complaints of pain on the medial side of their knee. Radiographs revealed advanced medial compartment arthritis with specifically an antero-medial wear pattern.  There was bone on bone changes noted with subchondral sclerosis and osteophytes present. The patient has had progressive problems failing to respond to conservative measures of medications, injections and activity modification. Risks of infection, DVT, component failure, need for future revision surgery were all discussed and reviewed.  Consent was obtained for benefit of pain relief.   PROCEDURE IN DETAIL: The patient was brought to the operative theater.  Once adequate anesthesia, preoperative antibiotics, 2gm Ancef administered, the patient was positioned in supine position with a left thigh tourniquet  placed. The left lower extremity was prepped and draped in sterile  fashion with the leg on the  Oxford leg holder.  The leg was allowed to flex to 120 degrees. A time-out  was performed identifying the patient, planned procedure, and extremity.  The leg was exsanguinated, tourniquet elevated to 250 mmHg. A midline  incision was made from the proximal pole of the patella to the tibial tubercle. A  soft tissue plane was created and partial median arthrotomy was then  made to allow for subluxation of the patella. Following initial synovectomy and  debridement, the osteophytes were removed off the medial aspect of the  knee.   Attention was first directed to the tibia. The tibial  extramedullary guide was positioned over the anterior crest of the tibia  and pinned into position, and using a measured resection guide from the  Oxford system, a 4 mm resection was made off the proximal tibia. First  the reciprocating saw along the medial aspect of the tibial spines, then the oscillating saw.    At this point, I sized this cut surface seem to be best fit for a size AA tibial tray.  With the retractors out of the wound and the knee held at 90 degrees the 3 feeler gaugeinitial had appropriate tension on the medial ligament.   At this point, the femoral canal was opened with a drill and the  intramedullary rod passed. Then using the guide for a size small femur resection off  the posterior aspect of the femur was positioned over the mid portion of the medial femoral condyle.  The orientation was set using the guide that mates the femoral guide to the intramedullary rod.  The 2 drill holes were made into the distal femur.  The posterior guide  was then impacted into place and the posterior  femoral cut made.  At this point, I milled the distal femur with a size 4 spigot in place. At this point, we did a trial reduction of the small femur, size AA tibial tray and a 4mm insert. At 90 degrees of  flexion and at 20 degrees of flexion the knee had symmetric tension on  the ligaments.   Given these  findings, the trial femoral component was removed. Final preparation of tibia was carried out by pinning it in position. Then  using a reciprocating saw I removed bone for the keel. Further bone was  removed with an osteotome.  Trial reduction was now carried out with the small femur, the AA keeled tibia, and a 4 lollipop insert. The balance of the  ligaments appeared to be symmetric at 20 degrees and 90 degrees. Given  all these findings, the trial components were removed.   Cement was mixed. The final components were opened. The knee was irrigated with  normal saline solution. Then final debridements of the  soft tissue was carried out, I also drilled the sclerotic bone with a drill.  The final components were cemented with a single batch of cement in a  two-stage technique with the tibial component cemented first. The knee  was then brought  to 45 degrees of flexion with a 4 feeler gauge, held with pressure for a minute and half.  After this the femoral component was cemented in place.  The knee was again held at 45 degrees of flexion while the cement fully cured.  Excess cement was removed throughout the knee. Tourniquet was let down  after 60 minutes. After the cement had fully cured and excessive cement  was removed throughout the knee there was no visualized cement present.   The final 4 mm was chosen and snapped into position. We re-irrigated  the knee. I placed a medium Hemovac drain deep. The extensor mechanism  was then reapproximated using a #1 Vicryl with the knee in flexion. The  remaining wound was closed with 2-0 Vicryl and a running 4-0 Monocryl.  The knee was cleaned, dried, and dressed sterilely using Dermabond and  Aquacel dressing. The drain site was dressed separately. The patient  was brought to the recovery room, Ace wrap in place, tolerating the  procedure well. He will be in the hospital for overnight observation.  We will initiate physical therapy and progress to  ambulate.     Madlyn Frankel [name], M.D.

## 2011-11-19 NOTE — Anesthesia Preprocedure Evaluation (Signed)
Anesthesia Evaluation  Patient identified by MRN, date of birth, ID band Patient awake    Reviewed: Allergy & Precautions, H&P , NPO status , Patient's Chart, lab work & pertinent test results  History of Anesthesia Complications (+) PONV  Airway Mallampati: II TM Distance: >3 FB Neck ROM: Full    Dental No notable dental hx.    Pulmonary neg pulmonary ROS,  clear to auscultation  Pulmonary exam normal       Cardiovascular neg cardio ROS Regular Normal    Neuro/Psych Negative Neurological ROS  Negative Psych ROS   GI/Hepatic negative GI ROS, Neg liver ROS,   Endo/Other  Negative Endocrine ROS  Renal/GU negative Renal ROS  Genitourinary negative   Musculoskeletal negative musculoskeletal ROS (+)   Abdominal   Peds negative pediatric ROS (+)  Hematology negative hematology ROS (+)   Anesthesia Other Findings   Reproductive/Obstetrics negative OB ROS                           Anesthesia Physical Anesthesia Plan  ASA: II  Anesthesia Plan: Spinal   Post-op Pain Management:    Induction: Intravenous  Airway Management Planned: Simple Face Mask  Additional Equipment:   Intra-op Plan:   Post-operative Plan: Extubation in OR  Informed Consent: I have reviewed the patients History and Physical, chart, labs and discussed the procedure including the risks, benefits and alternatives for the proposed anesthesia with the patient or authorized representative who has indicated his/her understanding and acceptance.   Dental advisory given  Plan Discussed with: CRNA  Anesthesia Plan Comments: (Discussed risks/benefits of spinal including headache, backache, failure, bleeding, infection, and nerve damage. Patient consents to spinal. Questions answered. Coagulation studies and platelet count acceptable. )        Anesthesia Quick Evaluation

## 2011-11-20 LAB — BASIC METABOLIC PANEL
BUN: 9 mg/dL (ref 6–23)
Creatinine, Ser: 0.68 mg/dL (ref 0.50–1.10)
GFR calc Af Amer: >90 mL/min (ref >90)
GFR calc non Af Amer: >90 mL/min (ref >90)
Potassium: 4 mEq/L (ref 3.5–5.1)

## 2011-11-20 LAB — CBC
HCT: 32.6 % — ABNORMAL LOW (ref 36.0–46.0)
MCHC: 32.5 g/dL (ref 30.0–36.0)
Platelets: 258 10*3/uL (ref 150–400)
RDW: 13.1 % (ref 11.5–15.5)

## 2011-11-20 MED ORDER — METHOCARBAMOL 500 MG PO TABS: 500.0000 mg | ORAL_TABLET | Freq: Four times a day (QID) | ORAL | Status: AC | PRN

## 2011-11-20 MED ORDER — HYDROCODONE-ACETAMINOPHEN 7.5-325 MG PO TABS: 1.0000 | ORAL_TABLET | ORAL | Status: AC

## 2011-11-20 MED ORDER — POLYETHYLENE GLYCOL 3350 17 G PO PACK: 17.0000 g | PACK | Freq: Two times a day (BID) | ORAL | Status: AC

## 2011-11-20 MED ORDER — ASPIRIN EC 325 MG PO TBEC: 325.0000 mg | DELAYED_RELEASE_TABLET | Freq: Two times a day (BID) | ORAL | Status: AC

## 2011-11-20 MED ORDER — FERROUS SULFATE 325 (65 FE) MG PO TABS: 325.0000 mg | ORAL_TABLET | Freq: Three times a day (TID) | ORAL | Status: DC

## 2011-11-20 MED ORDER — DIPHENHYDRAMINE HCL 25 MG PO CAPS: 25.0000 mg | ORAL_CAPSULE | Freq: Four times a day (QID) | ORAL | Status: DC | PRN

## 2011-11-20 MED ORDER — DSS 100 MG PO CAPS: 100.0000 mg | ORAL_CAPSULE | Freq: Two times a day (BID) | ORAL | Status: AC

## 2011-11-20 NOTE — Progress Notes (Signed)
Subjective: 1 Day Post-Op Procedure(s) (LRB): UNICOMPARTMENTAL KNEE (Left)   Patient reports pain as mild. No events. Ready to be discharged home.  Objective:   VITALS:   Filed Vitals:   11/20/11 0601  BP: 134/60  Pulse: 70  Temp: 98.1 F (36.7 C)  Resp: 18    Neurovascular intact Dorsiflexion/Plantar flexion intact Incision: dressing C/D/I No cellulitis present Compartment soft  LABS  Basename 11/20/11 0455  HGB 10.6*  HCT 32.6*  WBC 11.2*  PLT 258     Basename 11/20/11 0455  NA 137  K 4.0  BUN 9  CREATININE 0.68  GLUCOSE 144*     Assessment/Plan: 1 Day Post-Op Procedure(s) (LRB): UNICOMPARTMENTAL KNEE (Left)  HV drain d/c'ed Foley cath d/c'ed Advance diet Up with therapy Discharge home with home health Follow up in 2 weeks at [facility].  Follow-up Information    Follow up with OLIN,Jadarion Halbig D in 2 weeks.   Contact information:   [facility] [address] [place] [address] [phone]         Anastasio Auerbach. [place]   PAC  11/20/2011, 9:51 AM

## 2011-11-20 NOTE — Progress Notes (Signed)
CARE MANAGEMENT NOTE 11/20/2011  Patient:  Lauren Bowman, Lauren Bowman   Account Number:  [id number]  Date Initiated:  11/20/2011  Documentation initiated by:  Briseyda Fehr  Subjective/Objective Assessment:   62 yo female admitted 11/19/11 with left knee osteoarthritis S/P left knee partial replacement     Action/Plan:   D/C when medically stable   Anticipated DC Date:  11/20/2011   Anticipated DC Plan:  HOME W HOME HEALTH SERVICES      DC Planning Services  CM consult      [facility] Choice  HOME HEALTH   Choice offered to / List presented to:  C-1 Patient        HH arranged  HH-2 PT      [facility] agency  [facility]   Status of service:  Completed, signed off  Discharge Disposition:  HOME W HOME HEALTH SERVICES  Comments:  11/20/11, Kathi Der RNC-MNN, BSN, [phone], CM received referral.  CM met with pt to discuss [facility] services and offer choice for [facility] services.  Pt states she will be using [place] for [facility] services.  She also states her husband will be able to assist her at hoime as needed.

## 2011-11-20 NOTE — Progress Notes (Signed)
DC instructions reviewed with patient and her spouse.  Patient denies further questions or concerns at this time.  No changes noted since am assessment.  IV DC.  Rx given for norco.  Patient is being DC to home via husband.  Patient advised about our valuables policy and she reported that they had checked the room and had everything with them.  Home med rec reviewed, patient reported several medications on her home med list that she reported that she was not taking prior to admission.  She reported that she told them to take them off upon her pre-surgical meeting, but that they were still on there.  Patient advised to keep taking anything that she was taking prior to admission.

## 2011-11-20 NOTE — Evaluation (Signed)
Physical Therapy Evaluation Patient Details Name: Lauren Bowman MRN: [id number] DOB: August 06, 1951 Today's Date: 11/20/2011  Problem List:  Patient Active Problem List  Diagnoses  . S/P left uni knee replacement    Past Medical History:  Past Medical History  Diagnosis Date  . Eczema   . Psoriasis   . PONV (postoperative nausea and vomiting)   . Hyperlipidemia   . Arthritis   . Periodontitis     x 11 years/states not active- has been controlled with cleanings every 3 months  . Vertigo     benign  . No pertinent past medical history     Clearance Dr Duanne Guess / Jone Baseman NP on chart/per office no record of EKG   Past Surgical History:  Past Surgical History  Procedure Date  . Knee arthroscopy w/ laser '09,2003,12/12    LEFT    w/ debridement  . Chondroplasty 08/29/2011    Procedure: CHONDROPLASTY;  Surgeon: Loanne Drilling;  Location: [facility];  Service: Orthopedics;  Laterality: Left;  . Dilation and curettage of uterus     x3  . Breast surgery     implants 2006- saline  . Liposuction     abdomen,  thigh    PT Assessment/Plan/Recommendation PT Assessment Clinical Impression Statement: Pt s/p left uniknee replacement.  Pt did very well upon evaluation. Pt has crutches at home which she feels she doesn't need to practice with here and also declined performing 2 steps which she has to enter home because she felt confident she could perform them with spouse assist and one crutch.  Pt educated in exercises and given handout.  Pt to be discharged today. PT Recommendation/Assessment: Patent does not need any further PT services No Skilled PT: All education completed;Patient will have necessary level of assist by caregiver at discharge;Patient is supervision for all activity/mobility PT Recommendation Follow Up Recommendations: Home health PT Equipment Recommended: None recommended by PT PT Goals     PT Evaluation Precautions/Restrictions   Precautions Precautions: Knee Precaution Comments: Pt demonstrated 3 good SLR so KI not used. Required Braces or Orthoses: Yes Knee Immobilizer: Discontinue once straight leg raise with < 10 degree lag Restrictions Weight Bearing Restrictions: Yes LLE Weight Bearing: Weight bearing as tolerated Prior Functioning  Home Living Lives With: Spouse Type of Home: House Home Layout: Able to live on main level with bedroom/bathroom Home Access: Stairs to enter Entrance Stairs-Number of Steps: 2 Home Adaptive Equipment: Crutches Prior Function Level of Independence: Independent with basic ADLs;Independent with gait Vocation: Full time employment Cognition Cognition Arousal/Alertness: Awake/alert Overall Cognitive Status: Appears within functional limits for tasks assessed Orientation Level: Oriented X4 Sensation/Coordination   Extremity Assessment RLE Assessment RLE Assessment: Within Functional Limits LLE Assessment LLE Assessment: Not tested LLE Strength LLE Overall Strength Comments: NT but good quad contraction and at least 3/5 since she was able to perform exercises without assist Mobility (including Balance) Bed Mobility Bed Mobility: Yes Supine to Sit: 6: Modified independent (Device/Increase time) Transfers Transfers: Yes Sit to Stand: 5: Supervision Sit to Stand Details (indicate cue type and reason): verbal cue for hand placement Stand to Sit: 5: Supervision Ambulation/Gait Ambulation/Gait: Yes Ambulation/Gait Assistance: 5: Supervision Ambulation/Gait Assistance Details (indicate cue type and reason): pt did very well with RW, reports she has crutches at home which she has used for 3 previous knee scopes so she is comfortable going home with those Ambulation Distance (Feet): 400 Feet Assistive device: Rolling walker Gait Pattern: Step-through pattern    Exercise  Total Joint Exercises Quad Sets: Strengthening;Left;20 reps;Supine Short Arc Quad:  AROM;Strengthening;Left;10 reps;Supine Heel Slides: AAROM;Strengthening;Left;15 reps;Seated;Other (comment) (with sheet) Hip ABduction/ADduction: AROM;Strengthening;Left;10 reps;Supine Straight Leg Raises: AAROM;Seated;10 reps;Other (comment) (with sheet) Marching in Standing: AROM;Strengthening;Seated;10 reps End of Session PT - End of Session Activity Tolerance: Patient tolerated treatment well Patient left: in chair;with call bell in reach;with family/visitor present General Behavior During Session: [facility] for tasks performed Cognition: [facility] for tasks performed  Kyler Germer,KATHrine E 11/20/2011, 11:59 AM Pager: [phone]

## 2011-11-20 NOTE — Discharge Summary (Signed)
Physician Discharge Summary  Patient ID: Lauren Bowman MRN: [id number] DOB/AGE: 29-Jan-1951 61 y.o.  Admit date: 11/19/2011 Discharge date: 11/20/2011  Procedures:  Procedure(s) (LRB): UNICOMPARTMENTAL KNEE (Left)  Attending Physician: Shelda Pal, MD   Admission Diagnoses: Medial compartment arthritis, left knee  Discharge Diagnoses:  Principal Problem:  *S/P left uni knee replacement Hypercholesterolemia Psoriasis History of basal cell carcinoma on her face  HPI: This is a 61 year old lady with a history of medial compartment osteoarthritis of her left knee that has failed conservative management of arthroscopic treatment, injections, and viscosupplementation. At this time, she is now scheduled for unicompartmental arthroplasty of her left knee. The surgery, risks, benefits, and aftercare were discussed in detail with the patient, questions invited and answered. Note that she is not a candidate for tranexamic acid or dexamethasone and will not receive that at surgery. She is planning on going home after surgery. Her medical doctor is Dr. Duanne Guess. Note that she is on Stelara for her psoriasis. We did call over and speak to her dermatologist's nurse and in-charge of the Stelara and she is okay to proceed with surgery, though as discussed with the patient, any of the biologics do put her at slight increased risk of infection, and she will need to watch her wound closely after surgery. She is given her home medications of aspirin, iron, MiraLax, Colace, and Robaxin.  PCP: No primary provider on file.   Discharged Condition: good  Hospital Course:  Patient underwent the above stated procedure on 11/19/2011. Patient tolerated the procedure well and brought to the recovery room in good condition and subsequently to the floor.  POD #1 BP: 134/60 ; Pulse: 70 ; Temp: 98.1 F (36.7 C) ; Resp: 18  Pt's foley was removed, as well as the hemovac drain removed. IV was changed to a saline  lock. Patient reports pain as mild. No events. Ready to be discharged home. Neurovascular intact, dorsiflexion/plantar flexion intact, incision: dressing C/D/I, no cellulitis present and compartment soft.  LABS  Basename  11/20/11 0455   HGB  10.6*  HCT  32.6*    Discharge Exam: General appearance: alert, cooperative and no distress Extremities: Homans sign is negative, no sign of DVT, no edema, redness or tenderness in the calves or thighs and no ulcers, gangrene or trophic changes  Disposition: 01-Home or Self Care with follow up in 2 weeks  Follow-up Information    Follow up with OLIN,Nycholas Rayner D in 2 weeks.   Contact information:   [facility] [address], [address] [place] [address] [phone]          Discharge Orders    Future Orders Please Complete By Expires   Diet - low sodium heart healthy      Call MD / Call 911      Comments:   If you experience chest pain or shortness of breath, CALL 911 and be transported to the hospital emergency room.  If you develope a fever above 101 F, pus (white drainage) or increased drainage or redness at the wound, or calf pain, call your surgeon's office.   Discharge instructions      Comments:   Maintain surgical dressing for 8 days, then replace with gauze and tape. Keep the area dry and clean until follow up. Follow up in 2 weeks at [facility]. Call with any questions or concerns.     Constipation Prevention      Comments:   Drink plenty of fluids.  Prune juice  may be helpful.  You may use a stool softener, such as Colace (over the counter) 100 mg twice a day.  Use MiraLax (over the counter) for constipation as needed.   Increase activity slowly as tolerated      Weight Bearing as taught in Physical Therapy      Comments:   Use a walker or crutches as instructed.   Driving restrictions      Comments:   No driving for 4 weeks   TED hose      Comments:   Use stockings (TED  hose) for 2 weeks on both leg(s).  You may remove them at night for sleeping.   Change dressing      Comments:   Maintain surgical dressing for 8 days, then change the dressing daily with sterile 4 x 4 inch gauze dressing and tape. Keep the area dry and clean.      Current Discharge Medication List    START taking these medications   Details  aspirin EC 325 MG tablet Take 1 tablet (325 mg total) by mouth 2 (two) times daily. X 4 weeks Qty: 60 tablet, Refills: 0    diphenhydrAMINE (BENADRYL) 25 mg capsule Take 1 capsule (25 mg total) by mouth every 6 (six) hours as needed for itching, allergies or sleep.    docusate sodium 100 MG CAPS Take 100 mg by mouth 2 (two) times daily. Qty: 10 capsule    ferrous sulfate 325 (65 FE) MG tablet Take 1 tablet (325 mg total) by mouth 3 (three) times daily after meals.    HYDROcodone-acetaminophen (NORCO) 7.5-325 MG per tablet Take 1-2 tablets by mouth every 4 (four) hours. Qty: 120 tablet, Refills: 0    methocarbamol (ROBAXIN) 500 MG tablet Take 1 tablet (500 mg total) by mouth every 6 (six) hours as needed (muscle spasms).    polyethylene glycol (MIRALAX / GLYCOLAX) packet Take 17 g by mouth 2 (two) times daily.      CONTINUE these medications which have NOT CHANGED   Details  celecoxib (CELEBREX) 200 MG capsule Take 200 mg by mouth 2 (two) times daily.     Ustekinumab (STELARA) 45 MG/0.5ML SOLN Inject 45 mg into the skin. 4x/year    atorvastatin (LIPITOR) 10 MG tablet Take 10 mg by mouth every morning.     glucosamine-chondroitin 500-400 MG tablet Take 1 tablet by mouth as needed.     nitrofurantoin (MACRODANTIN) 100 MG capsule Take 100 mg by mouth as needed.    phenazopyridine (PYRIDIUM) 200 MG tablet Take 200 mg by mouth 3 (three) times daily as needed.    Vitamin D, Ergocalciferol, (DRISDOL) 50000 UNITS CAPS Take 50,000 Units by mouth every 7 (seven) days. On      STOP taking these medications     naproxen (NAPROSYN) 500 MG  tablet Comments:  Reason for Stopping:            Signed: Anastasio Auerbach. Wilmoth Rasnic   PAC  11/20/2011, 9:58 AM

## 2011-11-20 NOTE — Progress Notes (Signed)
OT Note:  Pt screened for OT.  No needs.  She has a high commode at home.  [place], [place] [phone] 11/20/2011

## 2011-11-21 ENCOUNTER — Encounter (HOSPITAL_COMMUNITY): Payer: Self-pay | Admitting: Orthopedic Surgery

## 2011-11-28 ENCOUNTER — Ambulatory Visit (AMBULATORY_SURGERY_CENTER): Payer: PRIVATE HEALTH INSURANCE | Admitting: Internal Medicine

## 2011-11-28 ENCOUNTER — Encounter: Payer: Self-pay | Admitting: Internal Medicine

## 2011-11-28 VITALS — BP 144/59 | HR 95 | Temp 98.2°F | Resp 18 | Ht 63.0 in | Wt 136.0 lb

## 2011-11-28 DIAGNOSIS — K259 Gastric ulcer, unspecified as acute or chronic, without hemorrhage or perforation: Secondary | ICD-10-CM

## 2011-11-28 DIAGNOSIS — K2960 Other gastritis without bleeding: Secondary | ICD-10-CM

## 2011-11-28 DIAGNOSIS — K2970 Gastritis, unspecified, without bleeding: Secondary | ICD-10-CM

## 2011-11-28 DIAGNOSIS — K2990 Gastroduodenitis, unspecified, without bleeding: Secondary | ICD-10-CM

## 2011-11-28 DIAGNOSIS — R1310 Dysphagia, unspecified: Secondary | ICD-10-CM

## 2011-11-28 DIAGNOSIS — D130 Benign neoplasm of esophagus: Secondary | ICD-10-CM

## 2011-11-28 DIAGNOSIS — R1319 Other dysphagia: Secondary | ICD-10-CM

## 2011-11-28 HISTORY — PX: UPPER GASTROINTESTINAL ENDOSCOPY: SHX188

## 2011-11-28 MED ORDER — SODIUM CHLORIDE 0.9 % IV SOLN: 500.0000 mL | INTRAVENOUS | Status: DC

## 2011-11-28 MED ORDER — ESOMEPRAZOLE MAGNESIUM 40 MG PO CPDR: 40.0000 mg | DELAYED_RELEASE_CAPSULE | Freq: Every day | ORAL | Status: DC

## 2011-11-28 MED ORDER — ESOMEPRAZOLE MAGNESIUM 40 MG PO CPDR: 40.0000 mg | DELAYED_RELEASE_CAPSULE | Freq: Every day | ORAL | Status: AC

## 2011-11-28 NOTE — Progress Notes (Signed)
Patient did not experience any of the following events: a burn prior to discharge; a fall within the facility; wrong site/side/patient/procedure/implant event; or a hospital transfer or hospital admission upon discharge from the facility. (G8907) Patient did not have preoperative order for IV antibiotic SSI prophylaxis. (G8918)  

## 2011-11-28 NOTE — Patient Instructions (Addendum)
The esophagus looked mildly inflamed just above where it meets the stomach. There were multiple erosions in the stomach. Biopsies of both were taken.I dilated your esophagus also. I suspect the aspirin has at least something to do with this. Start taking Nexium every day and slowly advance your diet as outlined in the instructions provided. [name], MD, FACG   YOU HAD AN ENDOSCOPIC PROCEDURE TODAY AT THE [facility]: Refer to the procedure report that was given to you for any specific questions about what was found during the examination.  If the procedure report does not answer your questions, please call your gastroenterologist to clarify.  If you requested that your care partner not be given the details of your procedure findings, then the procedure report has been included in a sealed envelope for you to review at your convenience later.  YOU SHOULD EXPECT: Some feelings of bloating in the abdomen. Passage of more gas than usual.  Walking can help get rid of the air that was put into your GI tract during the procedure and reduce the bloating. If you had a lower endoscopy (such as a colonoscopy or flexible sigmoidoscopy) you may notice spotting of blood in your stool or on the toilet paper. If you underwent a bowel prep for your procedure, then you may not have a normal bowel movement for a few days.  DIET: Your first meal following the procedure should be a light meal and then it is ok to progress to your normal diet.  A half-sandwich or bowl of soup is an example of a good first meal.  Heavy or fried foods are harder to digest and may make you feel nauseous or bloated.  Likewise meals heavy in dairy and vegetables can cause extra gas to form and this can also increase the bloating.  Drink plenty of fluids but you should avoid alcoholic beverages for 24 hours.  PLEASE FOLLOW THE ESOPHAGEAL DILATION DIET AS FOLLOWS:  -NOTHING BY MOUTH UNTIL 1:00PM  -CLEAR LIQUIDS FOR 1 HOUR  1:00PM-2:00PM  -SOFT FOODS FOR THE REST OF TODAY 2:00PM UNTIL TOMORROW  ACTIVITY: Your care partner should take you home directly after the procedure.  You should plan to take it easy, moving slowly for the rest of the day.  You can resume normal activity the day after the procedure however you should NOT DRIVE or use heavy machinery for 24 hours (because of the sedation medicines used during the test).    SYMPTOMS TO REPORT IMMEDIATELY: A gastroenterologist can be reached at any hour.  During normal business hours, 8:30 AM to 5:00 PM Monday through Friday, call [phone].  After hours and on weekends, please call the GI answering service at [phone] who will take a message and have the physician on call contact you.   Following upper endoscopy (EGD)  Vomiting of blood or coffee ground material  New chest pain or pain under the shoulder blades  Painful or persistently difficult swallowing  New shortness of breath  Fever of 100F or higher  Black, tarry-looking stools  FOLLOW UP: If any biopsies were taken you will be contacted by phone or by letter within the next 1-3 weeks.  Call your gastroenterologist if you have not heard about the biopsies in 3 weeks.  Our staff will call the home number listed on your records the next business day following your procedure to check on you and address any questions or concerns that you may have at that time regarding  the information given to you following your procedure. This is a courtesy call and so if there is no answer at the home number and we have not heard from you through the emergency physician on call, we will assume that you have returned to your regular daily activities without incident.  SIGNATURES/CONFIDENTIALITY: You and/or your care partner have signed paperwork which will be entered into your electronic medical record.  These signatures attest to the fact that that the information above on your After Visit Summary has been  reviewed and is understood.  Full responsibility of the confidentiality of this discharge information lies with you and/or your care-partner.

## 2011-11-28 NOTE — Progress Notes (Signed)
Dysphagia to solids only after eating [place] food Tuesday night. Feels like something "stuck in there". No prior hx of dysphagia or similar problems. [name], MD, [facility]

## 2011-11-28 NOTE — Op Note (Signed)
[facility] [address] [place], [place]  [address]  ENDOSCOPY PROCEDURE REPORT  PATIENT:  Lauren Bowman, Lauren Bowman  MR#:  [id number] BIRTHDATE:  04/20/1951, 60 yrs. old  GENDER:  female  ENDOSCOPIST:  [name], MD, [facility]  PROCEDURE DATE:  11/28/2011 PROCEDURE:  EGD with biopsy, [address], Elease Hashimoto Dilation of Esophagus ASA CLASS:  Class II INDICATIONS:  dysphagia Acute dysphagia started 36 hours ago after eating [place] food. recently started ASA 325 mg bid and ferrous sulfate after knee arthroscopy  MEDICATIONS:   These medications were titrated to patient response per physician's verbal order, Fentanyl 50 mcg IV, Versed 8 mg IV TOPICAL ANESTHETIC:  Cetacaine Spray  DESCRIPTION OF PROCEDURE:   After the risks benefits and alternatives of the procedure were thoroughly explained, informed consent was obtained.  The LB GIF-H180 [id number] endoscope was introduced through the mouth and advanced to the second portion of the duodenum, without limitations.  The instrument was slowly withdrawn as the mucosa was fully examined. <<PROCEDUREIMAGES>>  Erythema was found in the distal esophagus. Four areas in distal esophagus. Multiple biopsies were obtained and sent to pathology. Multiple erosions were found in the antrum. Multiple small erosions. Multiple biopsies were obtained and sent to pathology. Otherwise the examination was normal.    Retroflexed views revealed no abnormalities.    The scope was then withdrawn from the patient, a 73 [place] Maloney dilator was passed with trace heme and no difficulty,  and the procedure completed.  COMPLICATIONS:  None  ENDOSCOPIC IMPRESSION: 1) Erythema in the distal esophagus 2) Erosions, multiple in the antrum - likely from aspirin 3) Otherwise normal examination - 54 [place] Maloney dilation performed RECOMMENDATIONS: 1) Post dilation diet 2) start Nexium 40 mg daily x 1 month 3) Will notify biopsy results  [name], MD,  [facility]  CC:  The Patient, Lajoyce Corners, MD  n. Rosalie Doctor:   [name] at 11/28/2011 11:58 AM  [place], [phone]

## 2011-11-29 ENCOUNTER — Telehealth: Payer: Self-pay | Admitting: *Deleted

## 2011-11-29 NOTE — Telephone Encounter (Signed)
  Follow up Call-  Call back number 11/28/2011  Post procedure Call Back phone  # [phone]  Permission to leave phone message Yes     Patient questions:  Do you have a fever, pain , or abdominal swelling? no Pain Score  0 *  Have you tolerated food without any problems? no  Have you been able to return to your normal activities? yes  Do you have any questions about your discharge instructions: Diet   no Medications  no Follow up visit  no  Do you have questions or concerns about your Care? no  Actions: * If pain score is 4 or above: No action needed, pain <4.

## 2012-08-12 ENCOUNTER — Other Ambulatory Visit: Payer: Self-pay | Admitting: Family Medicine

## 2012-08-12 DIAGNOSIS — Z1231 Encounter for screening mammogram for malignant neoplasm of breast: Secondary | ICD-10-CM

## 2012-09-25 ENCOUNTER — Ambulatory Visit
Admission: RE | Admit: 2012-09-25 | Discharge: 2012-09-25 | Disposition: A | Discharge status: Home / Self Care | Payer: PRIVATE HEALTH INSURANCE | Source: Ambulatory Visit | Attending: Family Medicine | Admitting: Family Medicine

## 2012-09-25 DIAGNOSIS — Z1231 Encounter for screening mammogram for malignant neoplasm of breast: Secondary | ICD-10-CM

## 2013-10-20 ENCOUNTER — Other Ambulatory Visit: Payer: Self-pay

## 2013-10-20 ENCOUNTER — Ambulatory Visit
Admission: RE | Admit: 2013-10-20 | Discharge: 2013-10-20 | Disposition: A | Discharge status: Home / Self Care | Payer: PRIVATE HEALTH INSURANCE | Source: Ambulatory Visit

## 2013-10-20 DIAGNOSIS — Z1231 Encounter for screening mammogram for malignant neoplasm of breast: Secondary | ICD-10-CM

## 2014-10-24 ENCOUNTER — Other Ambulatory Visit: Payer: Self-pay

## 2014-10-24 DIAGNOSIS — Z1231 Encounter for screening mammogram for malignant neoplasm of breast: Secondary | ICD-10-CM

## 2014-11-10 ENCOUNTER — Ambulatory Visit
Admission: RE | Admit: 2014-11-10 | Discharge: 2014-11-10 | Disposition: A | Discharge status: Home / Self Care | Payer: PRIVATE HEALTH INSURANCE | Source: Ambulatory Visit

## 2014-11-10 DIAGNOSIS — Z1231 Encounter for screening mammogram for malignant neoplasm of breast: Secondary | ICD-10-CM

## 2015-10-24 ENCOUNTER — Telehealth: Payer: Self-pay | Admitting: Internal Medicine

## 2015-10-24 NOTE — Telephone Encounter (Signed)
Patient has been seen by Dr. Carlean Purl. Received referral to schedule colonoscopy. Colon and path report received and placed on Dr. Celesta Aver desk for review.

## 2015-10-26 ENCOUNTER — Encounter: Payer: Self-pay | Admitting: Internal Medicine

## 2015-10-26 NOTE — Telephone Encounter (Signed)
Colonoscopy scheduled.

## 2015-10-27 ENCOUNTER — Other Ambulatory Visit: Payer: Self-pay

## 2015-10-27 DIAGNOSIS — Z1231 Encounter for screening mammogram for malignant neoplasm of breast: Secondary | ICD-10-CM

## 2015-11-16 ENCOUNTER — Ambulatory Visit
Admission: RE | Admit: 2015-11-16 | Discharge: 2015-11-16 | Disposition: A | Discharge status: Home / Self Care | Payer: PRIVATE HEALTH INSURANCE | Source: Ambulatory Visit

## 2015-11-16 DIAGNOSIS — Z1231 Encounter for screening mammogram for malignant neoplasm of breast: Secondary | ICD-10-CM

## 2016-01-04 ENCOUNTER — Ambulatory Visit (AMBULATORY_SURGERY_CENTER): Payer: Self-pay | Admitting: *Deleted

## 2016-01-04 VITALS — Ht 63.0 in | Wt 135.0 lb

## 2016-01-04 DIAGNOSIS — Z800 Family history of malignant neoplasm of digestive organs: Secondary | ICD-10-CM

## 2016-01-04 NOTE — Progress Notes (Signed)
No egg or soy allergy known to patient  No issues with past sedation with any surgeries  or procedures, no intubation problems,  some post op N/V  No diet pills per patient No home 02 use per patient  No blood thinners per patient  Pt denies issues with constipation

## 2016-01-17 ENCOUNTER — Ambulatory Visit (INDEPENDENT_AMBULATORY_CARE_PROVIDER_SITE_OTHER): Payer: PRIVATE HEALTH INSURANCE | Admitting: Podiatry

## 2016-01-17 ENCOUNTER — Encounter: Payer: Self-pay | Admitting: Podiatry

## 2016-01-17 VITALS — BP 130/80 | HR 71 | Resp 12

## 2016-01-17 DIAGNOSIS — B351 Tinea unguium: Secondary | ICD-10-CM | POA: Diagnosis not present

## 2016-01-17 DIAGNOSIS — L989 Disorder of the skin and subcutaneous tissue, unspecified: Secondary | ICD-10-CM

## 2016-01-17 DIAGNOSIS — M205X9 Other deformities of toe(s) (acquired), unspecified foot: Secondary | ICD-10-CM | POA: Diagnosis not present

## 2016-01-17 NOTE — Progress Notes (Addendum)
   Subjective:    Patient ID: Lauren Bowman, female    DOB: [birth year], 65 y.o.   MRN: [id number]  HPI this patient presents to the office concerned about her discolored big toenail, especially on her right foot. She says the nail appears to be becoming more discolored and the last 2 years. She denies any pain or discomfort from the nail. She also describes having callus on the outside ball of her right foot. She has provided no treatment for this callus. She presents the office for an evaluation and treatment of these foot    Review of Systems  All other systems reviewed and are negative.      Objective:   Physical Exam GENERAL APPEARANCE: Alert, conversant. Appropriately groomed. No acute distress.  VASCULAR: Pedal pulses are  palpable at  [facility] and PT bilateral.  Capillary refill time is immediate to all digits,  Normal temperature gradient.  Digital hair growth is present bilateral  NEUROLOGIC: sensation is normal to 5.07 monofilament at 5/5 sites bilateral.  Light touch is intact bilateral, Muscle strength normal.  MUSCULOSKELETAL: acceptable muscle strength, tone and stability bilateral.  Intrinsic muscluature intact bilateral.  Rectus appearance of foot and digits noted bilateral. Mild dorsomedial exostosis with dorsal lipping first metatarsal B/L.  Functional hallux limitus noted.  DERMATOLOGIC: skin color, texture, and turgor are within normal limits.  No preulcerative lesions or ulcers  are seen, no interdigital maceration noted.  No open lesions present.  Digital nails are asymptomatic. No drainage noted. Two benign skin lesions sub 5th metatarsal right foot. Black  thrombii noted in lesions.  There is yellow discoloration distal nail plate right hallux.         Assessment & Plan:  Benign skin lesion right foot.  Discolored right hallux toenail   Functional hallux limitus 1st MPJ  B/L   IE  Discussed treatment of benign skin lesion right foot.  Took sample right hallux toenail  and sent to [facility].  Discussed orthotic therapy for her hallux limitus 1st MPJ  B/L   Gardiner Barefoot DPM

## 2016-01-19 ENCOUNTER — Ambulatory Visit (AMBULATORY_SURGERY_CENTER): Payer: PRIVATE HEALTH INSURANCE | Admitting: Internal Medicine

## 2016-01-19 ENCOUNTER — Encounter: Payer: Self-pay | Admitting: Internal Medicine

## 2016-01-19 VITALS — BP 111/71 | HR 48 | Temp 98.2°F | Resp 12 | Ht 63.0 in | Wt 135.0 lb

## 2016-01-19 DIAGNOSIS — K635 Polyp of colon: Secondary | ICD-10-CM

## 2016-01-19 DIAGNOSIS — Z1211 Encounter for screening for malignant neoplasm of colon: Secondary | ICD-10-CM | POA: Diagnosis present

## 2016-01-19 DIAGNOSIS — D125 Benign neoplasm of sigmoid colon: Secondary | ICD-10-CM

## 2016-01-19 DIAGNOSIS — K648 Other hemorrhoids: Secondary | ICD-10-CM | POA: Diagnosis not present

## 2016-01-19 MED ORDER — SODIUM CHLORIDE 0.9 % IV SOLN: 500.0000 mL | INTRAVENOUS | Status: DC

## 2016-01-19 NOTE — Progress Notes (Signed)
Called to room to assist during endoscopic procedure.  Patient ID and intended procedure confirmed with present staff. Received instructions for my participation in the procedure from the performing physician.  

## 2016-01-19 NOTE — Patient Instructions (Addendum)
I removed one tiny polyp. I saw scarring from prior hemorrhoid banding - I do think you could benefit from repeat banding. You may schedule an appointment if desired and we can discuss and decide.  I do not think you are at increased risk of colon cancer due to mother's history because she was > 60 when diagnosed.  I will let you know pathology results and when to have another routine colonoscopy by mail.  I appreciate the opportunity to care for you. Gatha Mayer, MD, FACG  YOU HAD AN ENDOSCOPIC PROCEDURE TODAY AT [facility]:   Refer to the procedure report that was given to you for any specific questions about what was found during the examination.  If the procedure report does not answer your questions, please call your gastroenterologist to clarify.  If you requested that your care partner not be given the details of your procedure findings, then the procedure report has been included in a sealed envelope for you to review at your convenience later.  YOU SHOULD EXPECT: Some feelings of bloating in the abdomen. Passage of more gas than usual.  Walking can help get rid of the air that was put into your GI tract during the procedure and reduce the bloating. If you had a lower endoscopy (such as a colonoscopy or flexible sigmoidoscopy) you may notice spotting of blood in your stool or on the toilet paper. If you underwent a bowel prep for your procedure, you may not have a normal bowel movement for a few days.  Please Note:  You might notice some irritation and congestion in your nose or some drainage.  This is from the oxygen used during your procedure.  There is no need for concern and it should clear up in a day or so.  SYMPTOMS TO REPORT IMMEDIATELY:   Following lower endoscopy (colonoscopy or flexible sigmoidoscopy):  Excessive amounts of blood in the stool  Significant tenderness or worsening of abdominal pains  Swelling of the abdomen that is new,  acute  Fever of 100F or higher   For urgent or emergent issues, a gastroenterologist can be reached at any hour by calling [phone].   DIET: Your first meal following the procedure should be a small meal and then it is ok to progress to your normal diet. Heavy or fried foods are harder to digest and may make you feel nauseous or bloated.  Likewise, meals heavy in dairy and vegetables can increase bloating.  Drink plenty of fluids but you should avoid alcoholic beverages for 24 hours.  ACTIVITY:  You should plan to take it easy for the rest of today and you should NOT DRIVE or use heavy machinery until tomorrow (because of the sedation medicines used during the test).    FOLLOW UP: Our staff will call the number listed on your records the next business day following your procedure to check on you and address any questions or concerns that you may have regarding the information given to you following your procedure. If we do not reach you, we will leave a message.  However, if you are feeling well and you are not experiencing any problems, there is no need to return our call.  We will assume that you have returned to your regular daily activities without incident.  If any biopsies were taken you will be contacted by phone or by letter within the next 1-3 weeks.  Please call us at [phone] if you have not  heard about the biopsies in 3 weeks.    SIGNATURES/CONFIDENTIALITY: You and/or your care partner have signed paperwork which will be entered into your electronic medical record.  These signatures attest to the fact that that the information above on your After Visit Summary has been reviewed and is understood.  Full responsibility of the confidentiality of this discharge information lies with you and/or your care-partner.  Polyp-handout given  Repeat colonoscopy will be determined by pathology.

## 2016-01-19 NOTE — Op Note (Signed)
[facility] Patient Name: Lauren Bowman Procedure Date: 01/19/2016 10:47 AM MRN: [id number] Endoscopist: Gatha Mayer , MD Age: 65 Date of Birth: 09/27/1950 Gender: Female Procedure:                Colonoscopy Indications:              Screening for colorectal malignant neoplasm, Last                            colonoscopy: 2007 Medicines:                Propofol per Anesthesia, Monitored Anesthesia Care Procedure:                Pre-Anesthesia Assessment:                           - Prior to the procedure, a History and Physical                            was performed, and patient medications and                            allergies were reviewed. The patient's tolerance of                            previous anesthesia was also reviewed. The risks                            and benefits of the procedure and the sedation                            options and risks were discussed with the patient.                            All questions were answered, and informed consent                            was obtained. Prior Anticoagulants: The patient has                            taken no previous anticoagulant or antiplatelet                            agents. ASA Grade Assessment: II - A patient with                            mild systemic disease. After reviewing the risks                            and benefits, the patient was deemed in                            satisfactory condition to undergo the procedure.  After obtaining informed consent, the colonoscope                            was passed under direct vision. Throughout the                            procedure, the patient's blood pressure, pulse, and                            oxygen saturations were monitored continuously. The                            Model CF-HQ190L [id number]) scope was introduced                            through the anus and advanced to the the cecum,                        identified by appendiceal orifice and ileocecal                            valve. The colonoscopy was performed without                            difficulty. The patient tolerated the procedure                            well. The quality of the bowel preparation was                            excellent. The bowel preparation used was Miralax.                            The appendiceal orifice and the rectum were                            photographed. Scope In: 11:08:20 AM Scope Out: 11:20:23 AM Scope Withdrawal Time: 0 hours 8 minutes 53 seconds  Total Procedure Duration: 0 hours 12 minutes 3 seconds  Findings:                 The perianal and digital rectal examinations were                            normal.                           A 4 mm polyp was found in the sigmoid colon. The                            polyp was sessile. The polyp was removed with a                            cold snare. Resection and retrieval were complete.  Verification of patient identification for the                            specimen was done. Estimated blood loss was minimal.                           Internal hemorrhoids were found during retroflexion.                           A scar was found in the rectum. Prior hemorrhoid                            banding.                           The exam was otherwise without abnormality on                            direct and retroflexion views. Complications:            No immediate complications. Estimated Blood Loss:     Estimated blood loss was minimal. Impression:               - One 4 mm polyp in the sigmoid colon, removed with                            a cold snare. Resected and retrieved.                           - Internal hemorrhoids.                           - Scar in the rectum.                           - The examination was otherwise normal on direct                            and  retroflexion views. Recommendation:           - Patient has a contact number available for                            emergencies. The signs and symptoms of potential                            delayed complications were discussed with the                            patient. Return to normal activities tomorrow.                            Written discharge instructions were provided to the                            patient.                           -  Resume previous diet.                           - Continue present medications.                           - Repeat colonoscopy is recommended. The                            colonoscopy date will be determined after pathology                            results from today's exam become available for                            review.                           - Consider repeat hemorrhoid banding to help with                            fecal seepage.                           - Has FHx CRCA but Mom > 60 at dx Gatha Mayer, MD 01/19/2016 11:30:38 AM This report has been signed electronically. CC Letter to:             [name]

## 2016-01-19 NOTE — Progress Notes (Signed)
Patient awakening,vss,report to rn 

## 2016-01-22 ENCOUNTER — Telehealth: Payer: Self-pay | Admitting: *Deleted

## 2016-01-22 NOTE — Telephone Encounter (Signed)
Number identifier, left message, follow-up  

## 2016-01-28 ENCOUNTER — Encounter: Payer: Self-pay | Admitting: Internal Medicine

## 2016-01-28 NOTE — Progress Notes (Signed)
Quick Note:  Benign polypoid mucosa Recall 2027 ______

## 2016-02-13 ENCOUNTER — Encounter: Payer: Self-pay | Admitting: Podiatry

## 2016-06-17 ENCOUNTER — Ambulatory Visit (INDEPENDENT_AMBULATORY_CARE_PROVIDER_SITE_OTHER): Payer: PRIVATE HEALTH INSURANCE | Admitting: Internal Medicine

## 2016-06-17 ENCOUNTER — Encounter (INDEPENDENT_AMBULATORY_CARE_PROVIDER_SITE_OTHER): Payer: Self-pay

## 2016-06-17 ENCOUNTER — Encounter: Payer: Self-pay | Admitting: Internal Medicine

## 2016-06-17 VITALS — BP 120/74 | HR 72 | Ht 62.75 in | Wt 137.0 lb

## 2016-06-17 DIAGNOSIS — K648 Other hemorrhoids: Secondary | ICD-10-CM

## 2016-06-17 DIAGNOSIS — L309 Dermatitis, unspecified: Secondary | ICD-10-CM

## 2016-06-17 DIAGNOSIS — N816 Rectocele: Secondary | ICD-10-CM

## 2016-06-17 MED ORDER — NYSTATIN-TRIAMCINOLONE 100000-0.1 UNIT/GM-% EX OINT: 1.0000 "application " | TOPICAL_OINTMENT | Freq: Two times a day (BID) | CUTANEOUS | 1 refills | Status: DC

## 2016-06-17 NOTE — Assessment & Plan Note (Signed)
Banded RP RTC 2-4 weeks

## 2016-06-17 NOTE — Patient Instructions (Addendum)
  HEMORRHOID BANDING PROCEDURE    FOLLOW-UP CARE   1. The procedure you have had should have been relatively painless since the banding of the area involved does not have nerve endings and there is no pain sensation.  The rubber band cuts off the blood supply to the hemorrhoid and the band may fall off as soon as 48 hours after the banding (the band may occasionally be seen in the toilet bowl following a bowel movement). You may notice a temporary feeling of fullness in the rectum which should respond adequately to plain Tylenol or Motrin.  2. Following the banding, avoid strenuous exercise that evening and resume full activity the next day.  A sitz bath (soaking in a warm tub) or bidet is soothing, and can be useful for cleansing the area after bowel movements.     3. To avoid constipation, take two tablespoons of natural wheat bran, natural oat bran, flax, Benefiber or any over the counter fiber supplement and increase your water intake to 7-8 glasses daily.    4. Unless you have been prescribed anorectal medication, do not put anything inside your rectum for two weeks: No suppositories, enemas, fingers, etc.  5. Occasionally, you may have more bleeding than usual after the banding procedure.  This is often from the untreated hemorrhoids rather than the treated one.  Don't be concerned if there is a tablespoon or so of blood.  If there is more blood than this, lie flat with your bottom higher than your head and apply an ice pack to the area. If the bleeding does not stop within a half an hour or if you feel faint, call our office at [phone] or go to the emergency room.  6. Problems are not common; however, if there is a substantial amount of bleeding, severe pain, chills, fever or difficulty passing urine (very rare) or other problems, you should call us at [phone] or report to the nearest emergency room.  7. Do not stay seated continuously for more than 2-3 hours for a day or  two after the procedure.  Tighten your buttock muscles 10-15 times every two hours and take 10-15 deep breaths every 1-2 hours.  Do not spend more than a few minutes on the toilet if you cannot empty your bowel; instead re-visit the toilet at a later time.    We will see you back on 07/16/2016 at 3:00PM for more bandings.     I appreciate the opportunity to care for you. Silvano Rusk, MD, [facility]

## 2016-06-17 NOTE — Progress Notes (Signed)
   Sxs: fecal soiling anal itching, difficult cleansing Defecates qd No pain with defecation  Patti Martinique, CMA present.   Anoderm inspection revealed erythematous perianal skin with some satellite lesions, small amounts of feces Anal wink was absent Digital exam revealed normal resting tone and voluntary squeeze. No tenderness No mass present - + moderate rectocele. Simulated defecation with valsalva revealed appropriate abdominal contraction and descent.     Anoscopy was performed with the patient in the left lateral decubitus position while a chaperone was present and revealed grade 2 internal hemorrhoids RP and LL and Grade 1 RA.   PROCEDURE NOTE: The patient presents with symptomatic grade 1-2  hemorrhoids, requesting rubber band ligation of his/her hemorrhoidal disease.  All risks, benefits and alternative forms of therapy were described and informed consent was obtained.   The decision was made to band the RP internal hemorrhoid, and the [place] was used to perform band ligation without complication.  Digital anorectal examination was then performed to assure proper positioning of the band, and to adjust the banded tissue as required.  The patient was discharged home without pain or other issues.  Dietary and behavioral recommendations were given and along with follow-up instructions.     The following adjunctive treatments were recommended:  Nystatin-triamcinolone ointment for perianal dermatitis  The patient will return in a few weeks for  follow-up and possible additional banding as required. No complications were encountered and the patient tolerated the procedure well.  I appreciate the opportunity to care for this patient. Cc;DEWEY,ELIZABETH, MD

## 2016-07-16 ENCOUNTER — Ambulatory Visit (INDEPENDENT_AMBULATORY_CARE_PROVIDER_SITE_OTHER): Payer: PRIVATE HEALTH INSURANCE | Admitting: Internal Medicine

## 2016-07-16 ENCOUNTER — Encounter: Payer: Self-pay | Admitting: Internal Medicine

## 2016-07-16 DIAGNOSIS — K648 Other hemorrhoids: Secondary | ICD-10-CM | POA: Diagnosis not present

## 2016-07-16 DIAGNOSIS — L309 Dermatitis, unspecified: Secondary | ICD-10-CM

## 2016-07-16 NOTE — Assessment & Plan Note (Signed)
Improved Banded LL Reassess 6 weeks

## 2016-07-16 NOTE — Patient Instructions (Addendum)
HEMORRHOID BANDING PROCEDURE    FOLLOW-UP CARE   1. The procedure you have had should have been relatively painless since the banding of the area involved does not have nerve endings and there is no pain sensation.  The rubber band cuts off the blood supply to the hemorrhoid and the band may fall off as soon as 48 hours after the banding (the band may occasionally be seen in the toilet bowl following a bowel movement). You may notice a temporary feeling of fullness in the rectum which should respond adequately to plain Tylenol or Motrin.  2. Following the banding, avoid strenuous exercise that evening and resume full activity the next day.  A sitz bath (soaking in a warm tub) or bidet is soothing, and can be useful for cleansing the area after bowel movements.     3. To avoid constipation, take two tablespoons of natural wheat bran, natural oat bran, flax, Benefiber or any over the counter fiber supplement and increase your water intake to 7-8 glasses daily.    4. Unless you have been prescribed anorectal medication, do not put anything inside your rectum for two weeks: No suppositories, enemas, fingers, etc.  5. Occasionally, you may have more bleeding than usual after the banding procedure.  This is often from the untreated hemorrhoids rather than the treated one.  Don't be concerned if there is a tablespoon or so of blood.  If there is more blood than this, lie flat with your bottom higher than your head and apply an ice pack to the area. If the bleeding does not stop within a half an hour or if you feel faint, call our office at [phone] or go to the emergency room.  6. Problems are not common; however, if there is a substantial amount of bleeding, severe pain, chills, fever or difficulty passing urine (very rare) or other problems, you should call us at [phone] or report to the nearest emergency room.  7. Do not stay seated continuously for more than 2-3 hours for a day or two  after the procedure.  Tighten your buttock muscles 10-15 times every two hours and take 10-15 deep breaths every 1-2 hours.  Do not spend more than a few minutes on the toilet if you cannot empty your bowel; instead re-visit the toilet at a later time.    We will see you again in December for a re-check, 08/23/2016 at 3:00pm.    I appreciate the opportunity to care for you. Silvano Rusk, MD, [facility]

## 2016-07-16 NOTE — Progress Notes (Signed)
   Sx update - RP banded 06/17/2016 Less fecal leakage noticed Had pressure sensation x 3 days or so after banding  Perianal dermatitis was worse w/ nystatin-triamcinolone - better now on hydrocortisone + Desitin  PROCEDURE NOTE: The patient presents with symptomatic grade 2 hemorrhoids, requesting rubber band ligation of his/her hemorrhoidal disease.  All risks, benefits and alternative forms of therapy were described and informed consent was obtained.   The anorectum was pre-medicated with 5% lidocaine and 0.125% NTG The decision was made to band the LL internal hemorrhoid, and the [place] was used to perform band ligation without complication.  Digital anorectal examination was then performed to assure proper positioning of the band, and to adjust the banded tissue as required.  The patient was discharged home without pain or other issues.  Dietary and behavioral recommendations were given and along with follow-up instructions.       The patient will return in early December for  follow-up and possible additional banding as required. No complications were encountered and the patient tolerated the procedure well.  [id number], MD

## 2016-07-16 NOTE — Assessment & Plan Note (Signed)
Says nystatin-triamcinolone made it worse Better w/ HC cream and less fecal soiling

## 2016-08-23 ENCOUNTER — Encounter: Payer: PRIVATE HEALTH INSURANCE | Admitting: Internal Medicine

## 2016-10-25 ENCOUNTER — Other Ambulatory Visit: Payer: Self-pay | Admitting: Family Medicine

## 2016-10-25 DIAGNOSIS — Z1231 Encounter for screening mammogram for malignant neoplasm of breast: Secondary | ICD-10-CM

## 2016-11-21 ENCOUNTER — Ambulatory Visit
Admission: RE | Admit: 2016-11-21 | Discharge: 2016-11-21 | Disposition: A | Discharge status: Home / Self Care | Payer: PRIVATE HEALTH INSURANCE | Source: Ambulatory Visit | Attending: Family Medicine | Admitting: Family Medicine

## 2016-11-21 DIAGNOSIS — Z1231 Encounter for screening mammogram for malignant neoplasm of breast: Secondary | ICD-10-CM

## 2017-10-21 ENCOUNTER — Other Ambulatory Visit: Payer: Self-pay | Admitting: Family Medicine

## 2017-10-21 DIAGNOSIS — Z1231 Encounter for screening mammogram for malignant neoplasm of breast: Secondary | ICD-10-CM

## 2017-12-01 ENCOUNTER — Ambulatory Visit: Payer: PRIVATE HEALTH INSURANCE

## 2017-12-25 ENCOUNTER — Ambulatory Visit
Admission: RE | Admit: 2017-12-25 | Discharge: 2017-12-25 | Disposition: A | Discharge status: Home / Self Care | Payer: PRIVATE HEALTH INSURANCE | Source: Ambulatory Visit | Attending: Family Medicine | Admitting: Family Medicine

## 2017-12-25 DIAGNOSIS — Z1231 Encounter for screening mammogram for malignant neoplasm of breast: Secondary | ICD-10-CM

## 2019-05-17 ENCOUNTER — Other Ambulatory Visit: Payer: Self-pay | Admitting: Family Medicine

## 2019-05-17 DIAGNOSIS — Z1231 Encounter for screening mammogram for malignant neoplasm of breast: Secondary | ICD-10-CM

## 2019-06-28 ENCOUNTER — Ambulatory Visit: Payer: PRIVATE HEALTH INSURANCE

## 2019-08-30 ENCOUNTER — Ambulatory Visit
Admission: RE | Admit: 2019-08-30 | Discharge: 2019-08-30 | Disposition: A | Discharge status: Home / Self Care | Payer: PRIVATE HEALTH INSURANCE | Source: Ambulatory Visit | Attending: Family Medicine | Admitting: Family Medicine

## 2019-08-30 ENCOUNTER — Other Ambulatory Visit: Payer: Self-pay

## 2019-08-30 DIAGNOSIS — Z1231 Encounter for screening mammogram for malignant neoplasm of breast: Secondary | ICD-10-CM

## 2019-10-18 ENCOUNTER — Ambulatory Visit: Payer: PRIVATE HEALTH INSURANCE | Attending: Internal Medicine

## 2019-10-18 DIAGNOSIS — Z23 Encounter for immunization: Secondary | ICD-10-CM | POA: Insufficient documentation

## 2019-10-18 NOTE — Progress Notes (Signed)
   Covid-19 Vaccination Clinic  Name:  Lauren Bowman    MRN: [id number] DOB: [birth year]  10/18/2019  [name] was observed post Covid-19 immunization for 15 minutes without incidence. She was provided with Vaccine Information Sheet and instruction to access the V-Safe system.   [name] was instructed to call 911 with any severe reactions post vaccine: [name] Difficulty breathing  . Swelling of your face and throat  . A fast heartbeat  . A bad rash all over your body  . Dizziness and weakness    Immunizations Administered    Name Date Dose VIS Date Route   Pfizer COVID-19 Vaccine 10/18/2019 10:17 AM 0.3 mL 09/03/2019 Intramuscular   Manufacturer: [place]   Lot: [id number]   [place]: [id number]

## 2019-11-08 ENCOUNTER — Ambulatory Visit: Payer: PRIVATE HEALTH INSURANCE | Attending: Internal Medicine

## 2019-11-08 DIAGNOSIS — Z23 Encounter for immunization: Secondary | ICD-10-CM | POA: Insufficient documentation

## 2019-11-08 NOTE — Progress Notes (Signed)
   Covid-19 Vaccination Clinic  Name:  Lauren Bowman    MRN: [id number] DOB: [birth year]  11/08/2019  Lauren Bowman was observed post Covid-19 immunization for 15 minutes without incidence. She was provided with Vaccine Information Sheet and instruction to access the V-Safe system.   Lauren Bowman was instructed to call 911 with any severe reactions post vaccine: [name] Difficulty breathing  . Swelling of your face and throat  . A fast heartbeat  . A bad rash all over your body  . Dizziness and weakness    Immunizations Administered    Name Date Dose VIS Date Route   Pfizer COVID-19 Vaccine 11/08/2019 10:58 AM 0.3 mL 09/03/2019 Intramuscular   Manufacturer: [place]   Lot: [id number]   [place]: [id number]

## 2020-03-21 LAB — IFOBT (OCCULT BLOOD): IFOBT: NEGATIVE

## 2020-04-14 ENCOUNTER — Encounter: Payer: Self-pay | Admitting: Internal Medicine

## 2020-06-23 ENCOUNTER — Ambulatory Visit (INDEPENDENT_AMBULATORY_CARE_PROVIDER_SITE_OTHER): Payer: PRIVATE HEALTH INSURANCE | Admitting: Internal Medicine

## 2020-06-23 ENCOUNTER — Encounter: Payer: Self-pay | Admitting: Internal Medicine

## 2020-06-23 VITALS — BP 108/68 | HR 64 | Ht 62.75 in | Wt 145.0 lb

## 2020-06-23 DIAGNOSIS — R195 Other fecal abnormalities: Secondary | ICD-10-CM

## 2020-06-23 DIAGNOSIS — K648 Other hemorrhoids: Secondary | ICD-10-CM | POA: Diagnosis not present

## 2020-06-23 NOTE — Progress Notes (Signed)
Lauren Bowman 70 y.o. [birth year] [id number]  Assessment & Plan:   Encounter Diagnoses  Name Primary?  . Heme positive stool Yes  . Internal hemorrhoids with fecal soiling     We talked about how her hemorrhoids are the likely cause of the heme positive stool but that I cannot confirm that. A colonoscopy is appropriate and I did explain that I felt compelled to recommend one.. She declines to have one.  She is interested in pursuing repeat hemorrhoidal banding cautiously, try to minimize any pressure or problems, to see if her fecal soiling which is improved would get even better. She will come back in December to do so once I have recovered from my hand injuries and we will hopefully be able to attempt banding at that time.  I do not think she should have repeated Hemoccults unless she is willing to have a colonoscopy sooner than 10 years after the last.  I appreciate the opportunity to care for this patient. CC: [name], MD    Subjective:   Chief Complaint: Heme positive stool hemorrhoids with fecal soiling  HPI Lauren Bowman returns at the request of Dr. Sarina Ill, during a physical exam a rectal exam revealed heme positive stool. I FOBT positive. She does not have bleeding but she has chronic symptomatic hemorrhoids she has been banded twice with improvement in fecal soiling but has not return for third banding. She has had some discomfort and pressure with the banding though the second time was better that made her reluctant. Colonoscopy April 2017 with internal hemorrhoids, benign mucosal polyp of sigmoid  No change in bowel habits or other GI complaints at this time.  Social history notable for son having had an ependymoma of the spinal cord with surgery and radiation therapy and she and her husband have been helping with that. The patient is now disabled and struggling with the aftermath of the surgery and radiation and that has been a major stressor.  Data review labs  show normal CBC 06/10/2020 hemoglobin 13.5 hematocrit 41 MCV 85. Comprehensive metabolic panel normal except for a slightly elevated globulin fraction at 2.8 normal TSH negative QuantiFERON TB Gold plus negative HIV Allergies  Allergen Reactions  . Tape Other (See Comments)    Sensitivity to tape- REQUESTS use paper  . [name] Isothiocyanate] Rash    nitrogen   Current Meds  Medication Sig  . cetirizine (ZYRTEC) 10 MG tablet Take 10 mg by mouth daily.  [name] ezetimibe-simvastatin (VYTORIN) 10-10 MG tablet Take 1 tablet by mouth at bedtime.   [name] losartan (COZAAR) 25 MG tablet Take 25 mg by mouth daily.  . montelukast (SINGULAIR) 10 MG tablet Take 10 mg by mouth as needed.  [name] Ustekinumab (STELARA) 45 MG/0.5ML SOLN Inject into the skin. EVERY 3-4 MONTHS   Past Medical History:  Diagnosis Date  . Arthritis   . Eczema   . Hemorrhoids   . Hyperlipidemia   . Periodontitis    x 11 years/states not active- has been controlled with cleanings every 3 months  . PONV (postoperative nausea and vomiting)   . Psoriasis   . Vertigo    benign   Past Surgical History:  Procedure Laterality Date  . BREAST SURGERY     implants 2006- saline  . CHONDROPLASTY  08/29/2011   Procedure: CHONDROPLASTY;  Surgeon: Gearlean Alf;  Location: [place];  Service: Orthopedics;  Laterality: Left;  . COLONOSCOPY    . DILATION AND CURETTAGE OF UTERUS  x3  . HEMORRHOID BANDING     x2  . KNEE ARTHROSCOPY W/ LASER  '09,2003,12/12    LEFT   w/ debridement  . LIPOSUCTION     abdomen,  thigh  . PARTIAL KNEE ARTHROPLASTY  11/19/2011   Procedure: UNICOMPARTMENTAL KNEE;  Surgeon: Mauri Pole, MD;  Location: [facility];  Service: Orthopedics;  Laterality: Left;  left uniknee on medial    (c-arm)   . shoulder labrum tear repair     with impingement   . 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   Review of Systems Knee pain shoulder pain  Objective:   Physical Exam BP 108/68   Pulse 64   Ht 5' 2.75" (1.594 m)   Wt 145 lb (65.8 kg)   LMP 11/13/2006   SpO2 99%   BMI 25.89 kg/m   Well-developed well-nourished no acute distress

## 2020-06-23 NOTE — Patient Instructions (Addendum)
You have been scheduled for an office visit with Dr Carlean Purl on 08/23/20 at 11:10 am to discuss possible hemorrhoidal banding.  If you are age 67 or older, your body mass index should be between 23-30. Your Body mass index is 25.89 kg/m. If this is out of the aforementioned range listed, please consider follow up with your Primary Care Provider.  Due to recent changes in healthcare laws, you may see the results of your imaging and laboratory studies on MyChart before your provider has had a chance to review them.  We understand that in some cases there may be results that are confusing or concerning to you. Not all laboratory results come back in the same time frame and the provider may be waiting for multiple results in order to interpret others.  Please give [place] 48 hours in order for your provider to thoroughly review all the results before contacting the office for clarification of your results.   Thank you for choosing me and [facility].  Gatha Mayer, M.D., [facility]

## 2020-08-08 ENCOUNTER — Other Ambulatory Visit: Payer: Self-pay | Admitting: Family Medicine

## 2020-08-08 DIAGNOSIS — Z1231 Encounter for screening mammogram for malignant neoplasm of breast: Secondary | ICD-10-CM

## 2020-08-23 ENCOUNTER — Encounter: Payer: Self-pay | Admitting: Internal Medicine

## 2020-08-23 ENCOUNTER — Ambulatory Visit: Payer: PRIVATE HEALTH INSURANCE | Admitting: Internal Medicine

## 2020-08-23 VITALS — BP 160/74 | HR 94 | Ht 62.75 in | Wt 144.0 lb

## 2020-08-23 DIAGNOSIS — K648 Other hemorrhoids: Secondary | ICD-10-CM | POA: Diagnosis not present

## 2020-08-23 NOTE — Progress Notes (Signed)
  HEMORRHOID LIGATION  Sxs - fecal smearing Past ligations 2017 helped but had discomfort RP and LL then  Jun McMurray, CMA present  Rectal - NL anoderm no mass, nontender  Anoscopy was performed with the patient in the left lateral decubitus position while a chaperone was present and revealed Gr 2 internal hemorrhoids all positions w/ RA most prominent.  PROCEDURE NOTE: The patient presents with symptomatic grade 2  hemorrhoids, requesting rubber band ligation of his/her hemorrhoidal disease.  All risks, benefits and alternative forms of therapy were described and informed consent was obtained.   The anorectum was pre-medicated with 0.125% NTG and 5% lidocaine The decision was made to band the RA internal hemorrhoid, and the [place] was used to perform band ligation without complication.  Digital anorectal examination was then performed to assure proper positioning of the band, and to adjust the banded tissue as required.  The patient was discharged home without pain or other issues.  Dietary and behavioral recommendations were given and along with follow-up instructions.      The patient will return in late December for  follow-up and possible additional banding as required. No complications were encountered and the patient tolerated the procedure well.  I appreciate the opportunity to care for you. Gatha Mayer, MD, [facility]  Cc;Dewey, Mechele Claude, MD

## 2020-08-23 NOTE — Patient Instructions (Addendum)
HEMORRHOID BANDING PROCEDURE    FOLLOW-UP CARE   1. The procedure you have had should have been relatively painless since the banding of the area involved does not have nerve endings and there is no pain sensation.  The rubber band cuts off the blood supply to the hemorrhoid and the band may fall off as soon as 48 hours after the banding (the band may occasionally be seen in the toilet bowl following a bowel movement). You may notice a temporary feeling of fullness in the rectum which should respond adequately to plain Tylenol or Motrin.  2. Following the banding, avoid strenuous exercise that evening and resume full activity the next day.  A sitz bath (soaking in a warm tub) or bidet is soothing, and can be useful for cleansing the area after bowel movements.     3. To avoid constipation, take two tablespoons of natural wheat bran, natural oat bran, flax, Benefiber or any over the counter fiber supplement and increase your water intake to 7-8 glasses daily.    4. Unless you have been prescribed anorectal medication, do not put anything inside your rectum for two weeks: No suppositories, enemas, fingers, etc.  5. Occasionally, you may have more bleeding than usual after the banding procedure.  This is often from the untreated hemorrhoids rather than the treated one.  Don't be concerned if there is a tablespoon or so of blood.  If there is more blood than this, lie flat with your bottom higher than your head and apply an ice pack to the area. If the bleeding does not stop within a half an hour or if you feel faint, call our office at [phone] or go to the emergency room.  6. Problems are not common; however, if there is a substantial amount of bleeding, severe pain, chills, fever or difficulty passing urine (very rare) or other problems, you should call us at [phone] or report to the nearest emergency room.  7. Do not stay seated continuously for more than 2-3 hours for a day or two  after the procedure.  Tighten your buttock muscles 10-15 times every two hours and take 10-15 deep breaths every 1-2 hours.  Do not spend more than a few minutes on the toilet if you cannot empty your bowel; instead re-visit the toilet at a later time.    Please follow up with Dr Carlean Purl 09/21/2020 at 3:10pm   I appreciate the opportunity to care for you. Silvano Rusk, MD, [facility]

## 2020-08-24 ENCOUNTER — Encounter: Payer: Self-pay | Admitting: Internal Medicine

## 2020-08-24 NOTE — Assessment & Plan Note (Signed)
RA banded - RTC 1 month

## 2020-09-19 ENCOUNTER — Ambulatory Visit: Payer: PRIVATE HEALTH INSURANCE

## 2020-09-21 ENCOUNTER — Encounter: Payer: Self-pay | Admitting: Internal Medicine

## 2020-09-21 ENCOUNTER — Ambulatory Visit (INDEPENDENT_AMBULATORY_CARE_PROVIDER_SITE_OTHER): Payer: PRIVATE HEALTH INSURANCE | Admitting: Internal Medicine

## 2020-09-21 VITALS — BP 124/60 | HR 88 | Ht 62.6 in | Wt 145.4 lb

## 2020-09-21 DIAGNOSIS — R159 Full incontinence of feces: Secondary | ICD-10-CM | POA: Diagnosis not present

## 2020-09-21 DIAGNOSIS — N393 Stress incontinence (female) (male): Secondary | ICD-10-CM

## 2020-09-21 NOTE — Progress Notes (Signed)
Lauren Bowman 69 y.o. [id number]  Assessment & Plan:   Encounter Diagnoses  Name Primary?  . Incontinence of feces, unspecified fecal incontinence type Yes  . SUI (stress urinary incontinence, female)    She is probably about the same.  She has had banding of all hemorrhoid columns at this point over time and I do not think she is much better.  I explained how pelvic floor physical therapy might help this and her stress urinary incontinence.  We will try that.  Other options would be to evaluate with anorectal manometry and if significant abnormalities consider stimulator placement by colorectal surgery to improve incontinence.  She thinks she can probably tolerate these issues without such aggressive measures but I just wanted her to understand what the algorithm is.  I appreciate the opportunity to care for this patient. CC: Lauren Bien, MD    Subjective:   Chief Complaint: Follow-up of fecal incontinence  HPI Lauren Bowman is here for follow-up she had the right anterior hemorrhoid banded on 08/23/2020 she did not have as much discomfort as she had with previous right posterior and left lateral banding but she still has some fecal smearing issues.  Leakage after defecation.  She also talks about having some stress urinary incontinence.  She has been pregnant 7 times and delivered 3 children with 4 miscarriages.  No clear history of birth trauma.  She does do Kegel exercises.  The resurgence of Covid has disrupted her sons plans to move out and also attend a conference.  He did get good news that there is no sign of ependymoma persisting after surgery for removal.  Repeat MRI in the spring 2022 as planned. Allergies  Allergen Reactions  . Tape Other (See Comments)    Sensitivity to tape- REQUESTS use paper  . [name] Isothiocyanate] Rash    nitrogen   Current Meds  Medication Sig  . cetirizine (ZYRTEC) 10 MG tablet Take 10 mg by mouth daily.  [name]  ezetimibe-simvastatin (VYTORIN) 10-10 MG tablet Take 1 tablet by mouth at bedtime.   [name] losartan (COZAAR) 25 MG tablet Take 25 mg by mouth daily.  . mineral oil-hydrophilic petrolatum (AQUAPHOR) ointment Apply 1 application topically as needed for dry skin.  . montelukast (SINGULAIR) 10 MG tablet Take 10 mg by mouth as needed.  . neomycin-polymyxin-hydrocortisone (CORTISPORIN) 3.5-10000-0.5 cream Apply topically 2 (two) times daily.  [name] Ustekinumab (STELARA) 45 MG/0.5ML SOLN Inject into the skin. EVERY 3-4 MONTHS   Past Medical History:  Diagnosis Date  . Arthritis   . Eczema   . Hemorrhoids   . Hyperlipidemia   . Periodontitis    x 11 years/states not active- has been controlled with cleanings every 3 months  . PONV (postoperative nausea and vomiting)   . Psoriasis   . Vertigo    benign   Past Surgical History:  Procedure Laterality Date  . BREAST SURGERY     implants 2006- saline  . CHONDROPLASTY  08/29/2011   Procedure: CHONDROPLASTY;  Surgeon: Gearlean Alf;  Location: [place];  Service: Orthopedics;  Laterality: Left;  . COLONOSCOPY    . DILATION AND CURETTAGE OF UTERUS     x3  . HEMORRHOID BANDING     x2  . KNEE ARTHROSCOPY W/ LASER  '09,2003,12/12    LEFT   w/ debridement  . LIPOSUCTION     abdomen,  thigh  . PARTIAL KNEE ARTHROPLASTY  11/19/2011   Procedure: UNICOMPARTMENTAL KNEE;  Surgeon: Pietro Cassis  [name], MD;  Location: [facility];  Service: Orthopedics;  Laterality: Left;  left uniknee on medial    (c-arm)   . shoulder labrum tear repair     with impingement   . 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   Review of Systems As per HPI  Objective:   Physical Exam BP 124/60 (BP Location: Left Arm, Patient Position: Sitting, Cuff Size: Normal)   Pulse 88   Ht 5' 2.6" (1.59 m) Comment: height measured without shoes  Wt 145 lb 6 oz (65.9 kg)   LMP 11/13/2006   BMI 26.08 kg/m

## 2020-09-21 NOTE — Patient Instructions (Signed)
We are referring you for pelvic floor physical therapy. They will contact you about making an appointment.  If you feel that doesn't help contact Dr Leone Payor about doing an anal rectal manometry.    I appreciate the opportunity to care for you. Stan Head, MD, [facility]

## 2020-10-16 ENCOUNTER — Ambulatory Visit: Payer: PRIVATE HEALTH INSURANCE | Attending: Internal Medicine | Admitting: Physical Therapy

## 2020-10-16 ENCOUNTER — Other Ambulatory Visit: Payer: Self-pay

## 2020-10-16 ENCOUNTER — Encounter: Payer: Self-pay | Admitting: Physical Therapy

## 2020-10-16 DIAGNOSIS — M6281 Muscle weakness (generalized): Secondary | ICD-10-CM | POA: Diagnosis present

## 2020-10-16 DIAGNOSIS — R278 Other lack of coordination: Secondary | ICD-10-CM | POA: Diagnosis present

## 2020-10-16 NOTE — Patient Instructions (Signed)
Access Code: [email or web address] URL: [email or web address] Date: 10/16/2020 Prepared by: Earlie Counts  Exercises Supine Diaphragmatic Breathing - 3 x daily - 7 x weekly - 1 sets - 10 reps [facility] [address] [place], [place] [address] Phone # [phone] Fax [phone]

## 2020-10-16 NOTE — Therapy (Signed)
[facility] [address], [address] [place], [place], [address] Phone: [phone]   Fax:  [phone]  Physical Therapy Evaluation  Patient Details  Name: Lauren Bowman MRN: [id number] Date of Birth: [birth year] Referring Provider (PT): Dr. Stan Head   Encounter Date: 10/16/2020   PT End of Session - 10/16/20 0926    Visit Number 1    Date for PT Re-Evaluation 01/08/21    Authorization Type [employer]    PT Start Time 0845    PT Stop Time 1000    PT Time Calculation (min) 75 min    Activity Tolerance Patient tolerated treatment well    Behavior During Therapy [facility] for tasks assessed/performed           Past Medical History:  Diagnosis Date  . Arthritis   . Eczema   . Hemorrhoids   . Hyperlipidemia   . Periodontitis    x 11 years/states not active- has been controlled with cleanings every 3 months  . PONV (postoperative nausea and vomiting)   . Psoriasis   . Vertigo    benign    Past Surgical History:  Procedure Laterality Date  . BREAST SURGERY     implants 2006- saline  . CHONDROPLASTY  08/29/2011   Procedure: CHONDROPLASTY;  Surgeon: Loanne Drilling;  Location: [facility];  Service: Orthopedics;  Laterality: Left;  . COLONOSCOPY    . DILATION AND CURETTAGE OF UTERUS     x3  . HEMORRHOID BANDING     x2  . KNEE ARTHROSCOPY W/ LASER  '09,2003,12/12    LEFT   w/ debridement  . LIPOSUCTION     abdomen,  thigh  . PARTIAL KNEE ARTHROPLASTY  11/19/2011   Procedure: UNICOMPARTMENTAL KNEE;  Surgeon: Shelda Pal, MD;  Location: [facility];  Service: Orthopedics;  Laterality: Left;  left uniknee on medial    (c-arm)   . shoulder labrum tear repair     with impingement   . UPPER GASTROINTESTINAL ENDOSCOPY  11-28-2011    There were no vitals filed for this visit.    Subjective Assessment - 10/16/20 0853    Subjective I have fecal leakage, hemorroids. Slowly getting rid of the hemorroids. Patient has  stress incontinence. Had 7 pregnancies. Patient worksout of the gym and does yoga.    Patient Stated Goals improve incontinence, toileting    Currently in Pain? 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 Stan Head    Onset Date/Surgical Date --   many years   Prior Therapy none      Precautions   Precautions None      Restrictions   Weight Bearing Restrictions No      Balance Screen   Has the patient fallen in the past 6 months No    Has the patient had a decrease in activity level because of a fear of falling?  No    Is the patient reluctant to leave their home because of a fear of falling?  No      Home Tourist information centre manager residence      Prior Function   Level of Independence Independent    Vocation Retired    Leisure  gym, yoga      Cognition   Overall Cognitive Status Within Functional Limits for tasks assessed      Observation/Other Assessments   Focus on Therapeutic Outcomes (FOTO)  PFIQ-7 57%; CRAIQ-7 29; UIQ-7 29      Posture/Postural Control   Posture/Postural Control Postural limitations    Posture Comments scoliosis      ROM / Strength   AROM / PROM / Strength AROM;PROM;Strength      AROM   Overall AROM Comments lumbar ROM is full      Strength   Right Hip External Rotation  3+/5    Right Hip ABduction 4/5    Right Hip ADduction 4/5    Left Hip Extension 4/5    Left Hip ABduction 4/5    Left Hip ADduction 4/5      Palpation   SI assessment  left ilium is higher    Palpation comment decreased mobility of the lower rib cage and places air in the abdomen; When patient breathes into the rib cage she will contract her abdomen                      Objective measurements completed on examination: See above findings.     Pelvic  Floor Special Questions - 10/16/20 0001    Prior Pregnancies Yes    Number of Pregnancies 7    Number of Vaginal Deliveries 3    Any difficulty with labor and deliveries --   tearing   Urinary Leakage Yes    Pad use None    Activities that cause leaking Coughing;Laughing;Sneezing;Lifting;With strong urge    Urinary frequency stool Type 3 and 4, BM per day is 2    Fecal incontinence Yes   after BM; sometimes after activity   Falling out feeling (prolapse) No    Skin Integrity Intact    External Palpation no tenderness    Prolapse None    Pelvic Floor Internal Exam Patient confirmed identification and approves PT to assess pelvic floor and treatment    Exam Type Vaginal;Rectal    Sensation vaginal 68/5 with no lift and fatiques after 2 seconds    Strength fair squeeze, definite lift   tightens when trying to bulge the rectum   Strength # of seconds 4   rectum           [facility] Adult PT Treatment/Exercise - 10/16/20 0001      Neuro Re-ed    Neuro Re-ed Details  using verbal and tactile cues to expand the lower rib cage, not contracting her abdomen and not raising her ches                  PT Education - 10/16/20 1001    Education Details Access Code: [email or web address]    Person(s) Educated Patient    Methods Explanation;Demonstration;Verbal cues;Handout    Comprehension Returned demonstration;Verbalized understanding            PT Short Term Goals - 10/16/20 1011      PT SHORT TERM GOAL #1   Title independent with the initial HEP    Time 4    Period Weeks    Status New    Target Date 11/13/20      PT SHORT TERM GOAL #2   Title ability to edpand the lower rib cage without lifting the upper rib cage and not cotract the abdominals    Time 4    Period Weeks  Status New    Target Date 11/13/20             PT Long Term Goals - 10/16/20 1012      PT LONG TERM GOAL #1   Title independent with advanced HEP    Time 12    Period Weeks    Target Date 01/08/21       PT LONG TERM GOAL #2   Title able to espand the lower rib cage with abdomen and bulge the pelvic floor to improve pelvic floor coordination    Time 12    Period Weeks    Status New    Target Date 01/08/21      PT LONG TERM GOAL #3   Title able to bulge the anus without contracting to push the therapist out of the anus    Time 12    Period Weeks    Status New    Target Date 01/08/21      PT LONG TERM GOAL #4   Title fecal smearing decreased >/= 75% due to improve pelvic floor strength and holding for 10 seconds    Time 12    Period Weeks    Status New    Target Date 01/08/21      PT LONG TERM GOAL #5   Title urinary leakage decreased >/= 75% due to improved vaginal strength and endurance    Time 12    Period Weeks    Status New    Target Date 01/08/21                  Plan - 10/16/20 1004    Clinical Impression Statement Patient is a 70 year old female with stress incontinence and hemorroids with fecal leakage. Patient has had several hemorroids banded but does not feel well afterwards. Patient reports no pain. Patient will leak urine and stool with coughing, laughing, sneezing, lifting. Patient does not wear pads due to the perineal area has sensitivity. Patient has difficulty with expanding her lower rib cage with breath and will contract her abdominals at the wrong time. She has tightness in the rib cage and abdominals. Vaginal strength is 2/5 wiht no lift and fatiques after 2 seconds. Anal strength is 3/5 with fatique after 4 seconds. Patient has difficulty with bearing down to push the therapist finger out of the anus and will contract instead of relaxing. Patient has some tightness in the anterior anal region. Patient would benefit from skilled therapy to improve pelvic floor coordination and improve her breath to move the diaphram and pelvic floor correctly together.    Personal Factors and Comorbidities Age;Comorbidity 2    Comorbidities hemoorhoids, Psoriasis, bil.  partial knee replacement    Examination-Activity Limitations Continence;Lift;Toileting;Bend    Examination-Participation Restrictions Community Activity    Stability/Clinical Decision Making Stable/Uncomplicated    Clinical Decision Making Low    Rehab Potential Excellent    PT Frequency 1x / week    PT Duration 12 weeks    PT Treatment/Interventions Biofeedback;Therapeutic activities;Therapeutic exercise;Neuromuscular re-education;Manual techniques;Patient/family education;Dry needling    PT Next Visit Plan work on lower rib mobility, expanding of the abdominals with the rib cage, manual work on the abdomen, work on bulging the pelvic floor    PT Home Exercise Plan Access Code: [email or web address]    Consulted and Agree with Plan of Care Patient           Patient will benefit from skilled therapeutic intervention in order to improve the following deficits  and impairments:  Decreased coordination,Increased fascial restricitons,Increased muscle spasms,Decreased activity tolerance,Decreased endurance,Decreased strength  Visit Diagnosis: Muscle weakness (generalized) - Plan: PT plan of care cert/re-cert  Other lack of coordination - Plan: PT plan of care cert/re-cert     Problem List Patient Active Problem List   Diagnosis Date Noted  . Rectocele 06/17/2016  . Internal hemorrhoids with fecal soiling 01/19/2016  . S/P left uni knee replacement 11/19/2011    Earlie Counts, PT 10/16/20 10:18 AM   [facility] [address] [place], [place], [address] Phone: [phone]   Fax:  [phone]  Name: Anjelika Ausburn MRN: [id number] Date of Birth: 11/03/1950

## 2020-10-27 ENCOUNTER — Ambulatory Visit: Payer: PRIVATE HEALTH INSURANCE

## 2020-11-03 ENCOUNTER — Ambulatory Visit: Payer: PRIVATE HEALTH INSURANCE | Attending: Internal Medicine | Admitting: Physical Therapy

## 2020-11-03 ENCOUNTER — Other Ambulatory Visit: Payer: Self-pay

## 2020-11-03 ENCOUNTER — Encounter: Payer: Self-pay | Admitting: Physical Therapy

## 2020-11-03 DIAGNOSIS — M6281 Muscle weakness (generalized): Secondary | ICD-10-CM | POA: Diagnosis not present

## 2020-11-03 DIAGNOSIS — R278 Other lack of coordination: Secondary | ICD-10-CM | POA: Insufficient documentation

## 2020-11-03 NOTE — Patient Instructions (Signed)
Access Code: [email or web address] URL: [email or web address] Date: 11/03/2020 Prepared by: Earlie Counts  Exercises Supine Abdominal Wall Massage - 1 x daily - 7 x weekly - 1 sets - 1 reps - 2-3 min hold Supine Pelvic Floor Contraction - 3 x daily - 7 x weekly - 1 sets - 10 reps - 5 sec hold Supine Bridge with Mini Swiss Ball Between Knees - 1 x daily - 7 x weekly - 1 sets - 10 reps [facility] [address], [place], [place] [address] Phone # [phone] Fax [phone]

## 2020-11-03 NOTE — Therapy (Signed)
[facility] [address] [place], [place], [address] Phone: [phone]   Fax:  [phone]  Physical Therapy Treatment  Patient Details  Name: Lauren Bowman MRN: [id number] Date of Birth: 1951/01/25 Referring Provider (PT): Dr. Silvano Rusk   Encounter Date: 11/03/2020   PT End of Session - 11/03/20 1020    Visit Number 2    Date for PT Re-Evaluation 01/08/21    Authorization Type [employer]    PT Start Time 1015    PT Stop Time 1055    PT Time Calculation (min) 40 min    Activity Tolerance Patient tolerated treatment well    Behavior During Therapy [facility] for tasks assessed/performed           Past Medical History:  Diagnosis Date  . Arthritis   . Eczema   . Hemorrhoids   . Hyperlipidemia   . Periodontitis    x 11 years/states not active- has been controlled with cleanings every 3 months  . PONV (postoperative nausea and vomiting)   . Psoriasis   . Vertigo    benign    Past Surgical History:  Procedure Laterality Date  . BREAST SURGERY     implants 2006- saline  . CHONDROPLASTY  08/29/2011   Procedure: CHONDROPLASTY;  Surgeon: Gearlean Alf;  Location: [place];  Service: Orthopedics;  Laterality: Left;  . COLONOSCOPY    . DILATION AND CURETTAGE OF UTERUS     x3  . HEMORRHOID BANDING     x2  . KNEE ARTHROSCOPY W/ LASER  '09,2003,12/12    LEFT   w/ debridement  . LIPOSUCTION     abdomen,  thigh  . PARTIAL KNEE ARTHROPLASTY  11/19/2011   Procedure: UNICOMPARTMENTAL KNEE;  Surgeon: Mauri Pole, MD;  Location: [facility];  Service: Orthopedics;  Laterality: Left;  left uniknee on medial    (c-arm)   . shoulder labrum tear repair     with impingement   . UPPER GASTROINTESTINAL ENDOSCOPY  11-28-2011    There were no vitals filed for this visit.   Subjective Assessment - 11/03/20 1018    Subjective I am having a bowel movement later in the day. Sometimes I strain to have a bowel  movement.    Patient Stated Goals improve incontinence, toileting    Currently in Pain? No/denies                             [facility] Adult PT Treatment/Exercise - 11/03/20 0001      Neuro Re-ed    Neuro Re-ed Details  diaphragmatic breathing into the rib cage then stomach with tactile cues but diffuclty to do and get the concept      Lumbar Exercises: Supine   Ab Set 10 reps;5 seconds    AB Set Limitations with pelvic floor contraction    Bridge with [employer] 1 second    Bridge with [employer] Limitations with pelvic floor contraction      Manual Therapy   Manual Therapy Soft tissue mobilization;Myofascial release    Soft tissue mobilization circular massage to abdoman to improve peristalic for the intestines    Myofascial Release release around the bladder                  PT Education - 11/03/20 1107    Education Details Access Code: [email or web address]    Person(s) Educated Patient    Methods Explanation;Demonstration;Verbal  cues;Handout    Comprehension Returned demonstration;Verbalized understanding            PT Short Term Goals - 11/03/20 1112      PT SHORT TERM GOAL #1   Title independent with the initial HEP    Time 4    Period Weeks    Status On-going      PT SHORT TERM GOAL #2   Title ability to edpand the lower rib cage without lifting the upper rib cage and not cotract the abdominals    Time 4    Period Weeks    Status On-going             PT Long Term Goals - 10/16/20 1012      PT LONG TERM GOAL #1   Title independent with advanced HEP    Time 12    Period Weeks    Target Date 01/08/21      PT LONG TERM GOAL #2   Title able to espand the lower rib cage with abdomen and bulge the pelvic floor to improve pelvic floor coordination    Time 12    Period Weeks    Status New    Target Date 01/08/21      PT LONG TERM GOAL #3   Title able to bulge the anus without contracting to push the therapist out of the anus    Time  12    Period Weeks    Status New    Target Date 01/08/21      PT LONG TERM GOAL #4   Title fecal smearing decreased >/= 75% due to improve pelvic floor strength and holding for 10 seconds    Time 12    Period Weeks    Status New    Target Date 01/08/21      PT LONG TERM GOAL #5   Title urinary leakage decreased >/= 75% due to improved vaginal strength and endurance    Time 12    Period Weeks    Status New    Target Date 01/08/21                 Plan - 11/03/20 1021    Clinical Impression Statement Patient has difficulty doing the diaphragmatic breathing so we will not have her do it. Patient is able to contract the pelvic floor and feel how long she is holding it. Patient is able to contract the lower abdominals correctly with a pelvic floor contraction. Patient was able to do the bridge correctly. Patient had improve abdominal tissue softness after the abdominal massage to reduce the pressure on the bladder. Patient is not having issues with straining with a bowel movement and having them regulary. Patient will benefit from skilled therapy to improve pelvic floor coordination  to reduce urinary leakage.    Personal Factors and Comorbidities Age;Comorbidity 2    Comorbidities hemoorhoids, Psoriasis, bil. partial knee replacement; go over the urge to void    Examination-Activity Limitations Continence;Lift;Toileting;Bend    Examination-Participation Restrictions Community Activity    Stability/Clinical Decision Making Stable/Uncomplicated    Rehab Potential Excellent    PT Frequency 1x / week    PT Duration 12 weeks    PT Treatment/Interventions Biofeedback;Therapeutic activities;Therapeutic exercise;Neuromuscular re-education;Manual techniques;Patient/family education;Dry needling    PT Next Visit Plan progress pelvic floor contraction with her exercise routine    PT Home Exercise Plan Access Code: [email or web address]    Recommended Other Services MD signed the initial summary  Consulted and Agree with Plan of Care Patient           Patient will benefit from skilled therapeutic intervention in order to improve the following deficits and impairments:  Decreased coordination,Increased fascial restricitons,Increased muscle spasms,Decreased activity tolerance,Decreased endurance,Decreased strength  Visit Diagnosis: Muscle weakness (generalized)  Other lack of coordination     Problem List Patient Active Problem List   Diagnosis Date Noted  . Rectocele 06/17/2016  . Internal hemorrhoids with fecal soiling 01/19/2016  . S/P left uni knee replacement 11/19/2011    Earlie Counts, PT 11/03/20 11:13 AM   [facility] [address] [place], [place], [address] Phone: [phone]   Fax:  [phone]  Name: Lauren Bowman MRN: [id number] Date of Birth: [birth year]

## 2020-11-17 ENCOUNTER — Ambulatory Visit: Payer: PRIVATE HEALTH INSURANCE | Admitting: Physical Therapy

## 2020-11-27 ENCOUNTER — Ambulatory Visit: Payer: PRIVATE HEALTH INSURANCE | Attending: Internal Medicine | Admitting: Physical Therapy

## 2020-11-27 ENCOUNTER — Other Ambulatory Visit: Payer: Self-pay

## 2020-11-27 ENCOUNTER — Encounter: Payer: Self-pay | Admitting: Physical Therapy

## 2020-11-27 DIAGNOSIS — R278 Other lack of coordination: Secondary | ICD-10-CM | POA: Insufficient documentation

## 2020-11-27 DIAGNOSIS — M6281 Muscle weakness (generalized): Secondary | ICD-10-CM | POA: Insufficient documentation

## 2020-11-27 NOTE — Therapy (Signed)
[facility] [address], [place], [place], [address] Phone: [phone]   Fax:  [phone]  Physical Therapy Treatment  Patient Details  Name: Lauren Bowman MRN: [id number] Date of Birth: Sep 03, 1951 Referring Provider (PT): Dr. Silvano Rusk   Encounter Date: 11/27/2020   PT End of Session - 11/27/20 1210    Visit Number 3    Date for PT Re-Evaluation 01/08/21    Authorization Type [employer]    PT Start Time 1145    PT Stop Time 1225    PT Time Calculation (min) 40 min    Activity Tolerance Patient tolerated treatment well    Behavior During Therapy [facility] for tasks assessed/performed           Past Medical History:  Diagnosis Date  . Arthritis   . Eczema   . Hemorrhoids   . Hyperlipidemia   . Periodontitis    x 11 years/states not active- has been controlled with cleanings every 3 months  . PONV (postoperative nausea and vomiting)   . Psoriasis   . Vertigo    benign    Past Surgical History:  Procedure Laterality Date  . BREAST SURGERY     implants 2006- saline  . CHONDROPLASTY  08/29/2011   Procedure: CHONDROPLASTY;  Surgeon: Gearlean Alf;  Location: [place];  Service: Orthopedics;  Laterality: Left;  . COLONOSCOPY    . DILATION AND CURETTAGE OF UTERUS     x3  . HEMORRHOID BANDING     x2  . KNEE ARTHROSCOPY W/ LASER  '09,2003,12/12    LEFT   w/ debridement  . LIPOSUCTION     abdomen,  thigh  . PARTIAL KNEE ARTHROPLASTY  11/19/2011   Procedure: UNICOMPARTMENTAL KNEE;  Surgeon: Mauri Pole, MD;  Location: [facility];  Service: Orthopedics;  Laterality: Left;  left uniknee on medial    (c-arm)   . shoulder labrum tear repair     with impingement   . UPPER GASTROINTESTINAL ENDOSCOPY  11-28-2011    There were no vitals filed for this visit.   Subjective Assessment - 11/27/20 1153    Subjective Massaging the abdominal massage has helped.  When I do the squat I am seeping stoolI am  working with my trainer for strength, core and pelvic floor. I have a bowel movement 4 times per day. The two bowel movement are 30 min to 1 hour apart.    Patient Stated Goals improve incontinence, toileting    Currently in Pain? No/denies                          Pelvic Floor Special Questions - 11/27/20 0001    Activities that cause leaking Coughing;Laughing;Sneezing;Lifting;With strong urge    Urinary frequency stool Type 3 and 4, BM per day is 2    Fecal incontinence Yes   after BM; sometimes after activity   Strength fair squeeze, definite lift             [facility] Adult PT Treatment/Exercise - 11/27/20 0001      Exercises   Exercises Other Exercises    Other Exercises  had patient demonstrate her exercise program she is doing with the trainer and looked at how she was contracting her abdominals and contracting her pelvic floor so she is engaging them during her workout. Squats with correct posture and pelvic floor contraction; abdominal exercises in supine with pelvic floor contraction; how to breath  correctly with exercise so she is not putting pressure on the pelvic floor and causing leakage.                  PT Education - 11/27/20 1714    Education Details went over patient exercises to incorporate the pelvic floor with her routine she is doing with the trainer and the gym    Person(s) Educated Patient    Methods Explanation;Demonstration    Comprehension Verbalized understanding;Returned demonstration            PT Short Term Goals - 11/27/20 1718      PT SHORT TERM GOAL #1   Title independent with the initial HEP    Time 4    Period Weeks    Status Achieved      PT SHORT TERM GOAL #2   Title ability to edpand the lower rib cage without lifting the upper rib cage and not cotract the abdominals    Time 4    Period Weeks    Status Achieved             PT Long Term Goals - 11/27/20 1718      PT LONG TERM GOAL #1   Title independent  with advanced HEP    Time 12    Period Weeks    Status Achieved      PT LONG TERM GOAL #2   Title able to espand the lower rib cage with abdomen and bulge the pelvic floor to improve pelvic floor coordination    Time 12    Period Weeks    Status Achieved      PT LONG TERM GOAL #3   Title able to bulge the anus without contracting to push the therapist out of the anus    Time 12    Period Weeks    Status Not Met      PT LONG TERM GOAL #4   Title fecal smearing decreased >/= 75% due to improve pelvic floor strength and holding for 10 seconds    Time 12    Period Weeks    Status Not Met      PT LONG TERM GOAL #5   Title urinary leakage decreased >/= 75% due to improved vaginal strength and endurance    Time 12    Period Weeks    Status Not Met                 Plan - 11/27/20 1715    Clinical Impression Statement Patient is happy with her exercises she has and the education on correct toileting. Patient will still leak a small amount of urine and stool but wants to work on this at home. She is at the gym 5 days a week to exercise and was instructed  on how to contract the anus with her exercise to continue strengthening. Patient is working with a Clinical research associate that is incorporating the core and having her not bulge her abdomen. Patient is not having issue with straining the pelvic floor. Patient will benefit from skilled therapy to improve pelvic floor coordination to reduce her urinary leakage.    Personal Factors and Comorbidities Age;Comorbidity 2    Comorbidities hemoorhoids, Psoriasis, bil. partial knee replacement; go over the urge to void    Examination-Activity Limitations Continence;Lift;Toileting;Bend    Examination-Participation Restrictions Community Activity    Stability/Clinical Decision Making Stable/Uncomplicated    Rehab Potential Excellent    PT Treatment/Interventions Biofeedback;Therapeutic activities;Therapeutic exercise;Neuromuscular re-education;Manual  techniques;Patient/family  education;Dry needling    PT Next Visit Plan Discharge to HEP    PT Home Exercise Plan Access Code: [email or web address]    Consulted and Agree with Plan of Care Patient           Patient will benefit from skilled therapeutic intervention in order to improve the following deficits and impairments:  Decreased coordination,Increased fascial restricitons,Increased muscle spasms,Decreased activity tolerance,Decreased endurance,Decreased strength  Visit Diagnosis: Muscle weakness (generalized)  Other lack of coordination     Problem List Patient Active Problem List   Diagnosis Date Noted  . Rectocele 06/17/2016  . Internal hemorrhoids with fecal soiling 01/19/2016  . S/P left uni knee replacement 11/19/2011    Earlie Counts, PT 11/27/20 5:20 PM   [facility] [address], [place], [place], [address] Phone: [phone]   Fax:  [phone]  Name: Lauren Bowman MRN: [id number] Date of Birth: 02/19/1951 PHYSICAL THERAPY DISCHARGE SUMMARY  Visits from Start of Care: 3  Current functional level related to goals / functional outcomes: See above.    Remaining deficits: See above.   Education / Equipment: HEP  Plan: Patient agrees to discharge.  Patient goals were partially met. Patient is being discharged due to being pleased with the current functional level. Thank you for the referral. Earlie Counts, PT 11/27/20 5:20 PM  ????

## 2020-12-05 ENCOUNTER — Ambulatory Visit
Admission: RE | Admit: 2020-12-05 | Discharge: 2020-12-05 | Disposition: A | Discharge status: Home / Self Care | Payer: PRIVATE HEALTH INSURANCE | Source: Ambulatory Visit | Attending: Family Medicine | Admitting: Family Medicine

## 2020-12-05 ENCOUNTER — Other Ambulatory Visit: Payer: Self-pay

## 2020-12-05 DIAGNOSIS — Z1231 Encounter for screening mammogram for malignant neoplasm of breast: Secondary | ICD-10-CM

## 2020-12-11 ENCOUNTER — Encounter: Payer: PRIVATE HEALTH INSURANCE | Admitting: Physical Therapy

## 2020-12-25 ENCOUNTER — Encounter: Payer: PRIVATE HEALTH INSURANCE | Admitting: Physical Therapy

## 2021-01-08 ENCOUNTER — Ambulatory Visit: Payer: PRIVATE HEALTH INSURANCE | Admitting: Physical Therapy

## 2021-06-27 IMAGING — MG DIGITAL SCREENING BREAST BILAT IMPLANT W/ TOMO W/ CAD
9 of 12 series · 9 of 28 positions shown · non-contrast
Comparison: Previous exam(s).

CLINICAL DATA: Screening.

EXAM:
DIGITAL SCREENING BILATERAL MAMMOGRAM WITH IMPLANTS, CAD AND
TOMOSYNTHESIS
TECHNIQUE: Bilateral screening digital craniocaudal and mediolateral oblique
mammograms were obtained. Bilateral screening digital breast
tomosynthesis was performed. The images were evaluated with
computer-aided detection. Standard and/or implant displaced views
were performed.

[L CC]
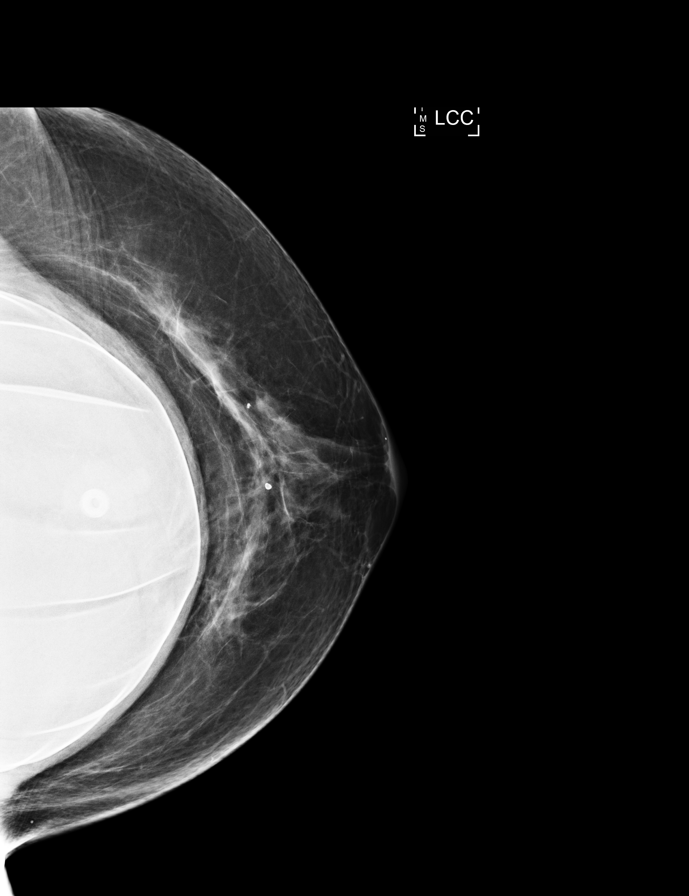

[R CC]
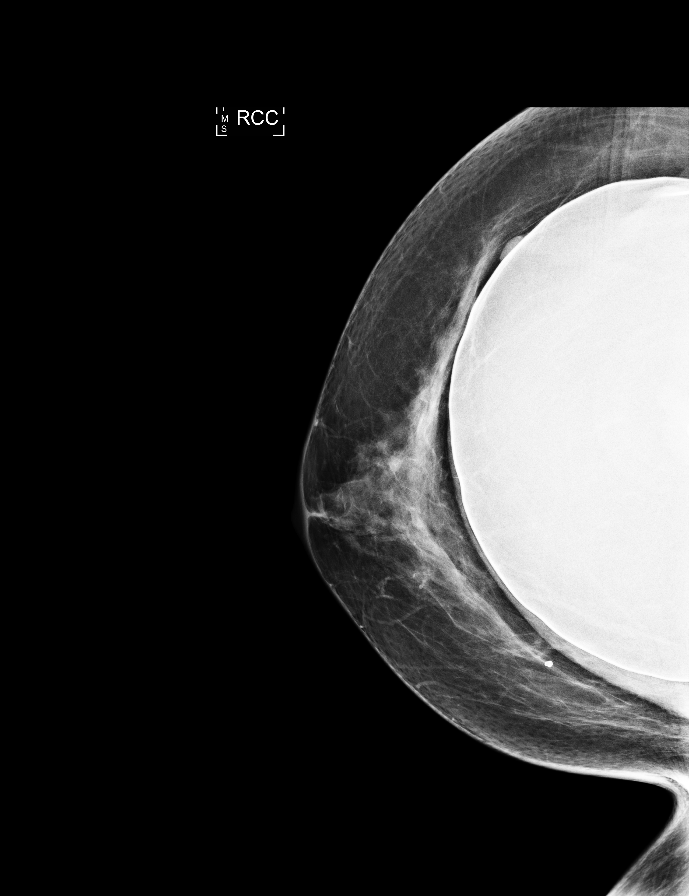

[R MLO]
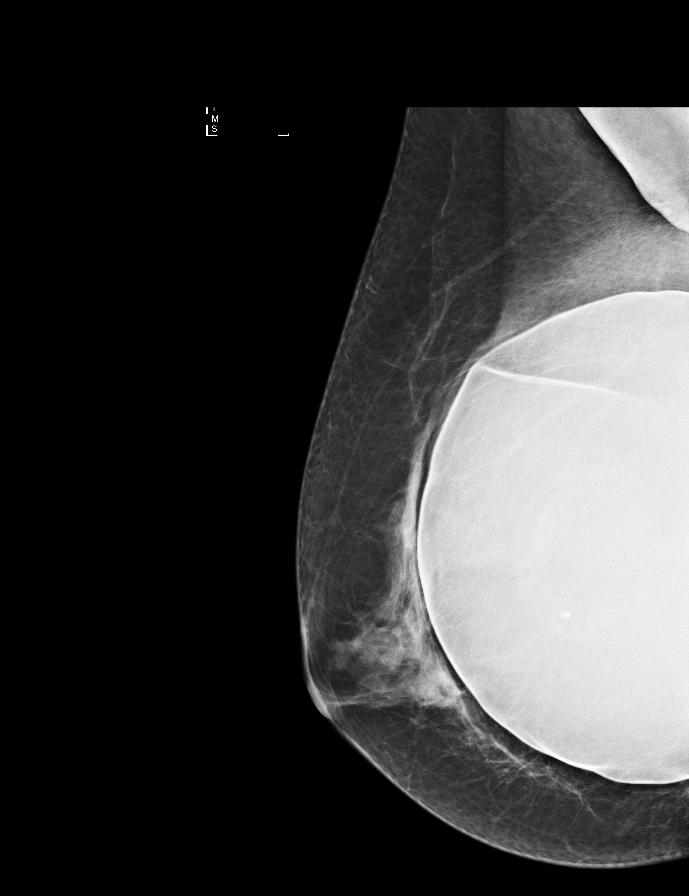

[L MLO]
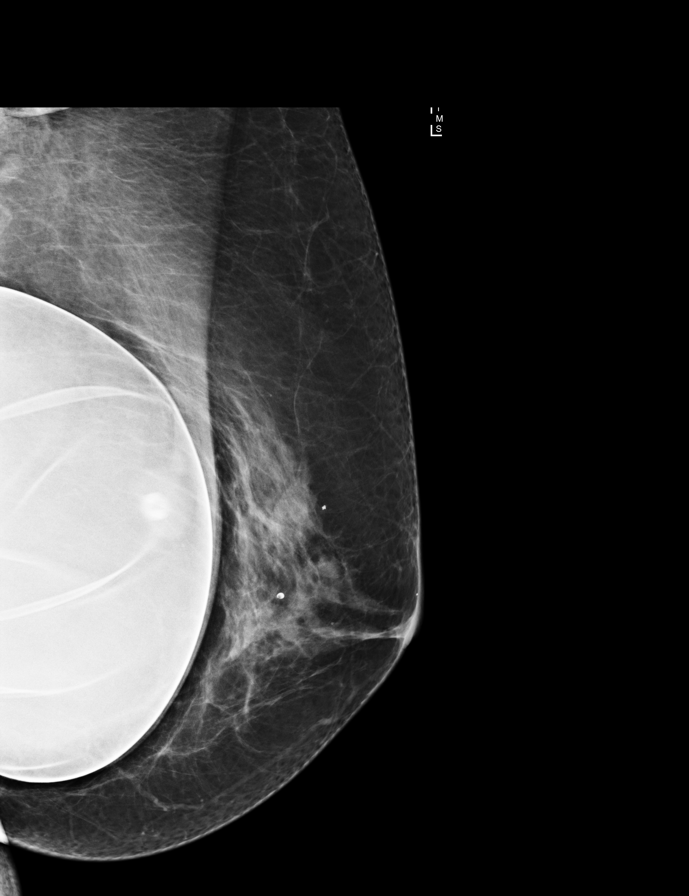

[R MLO synth-2D]
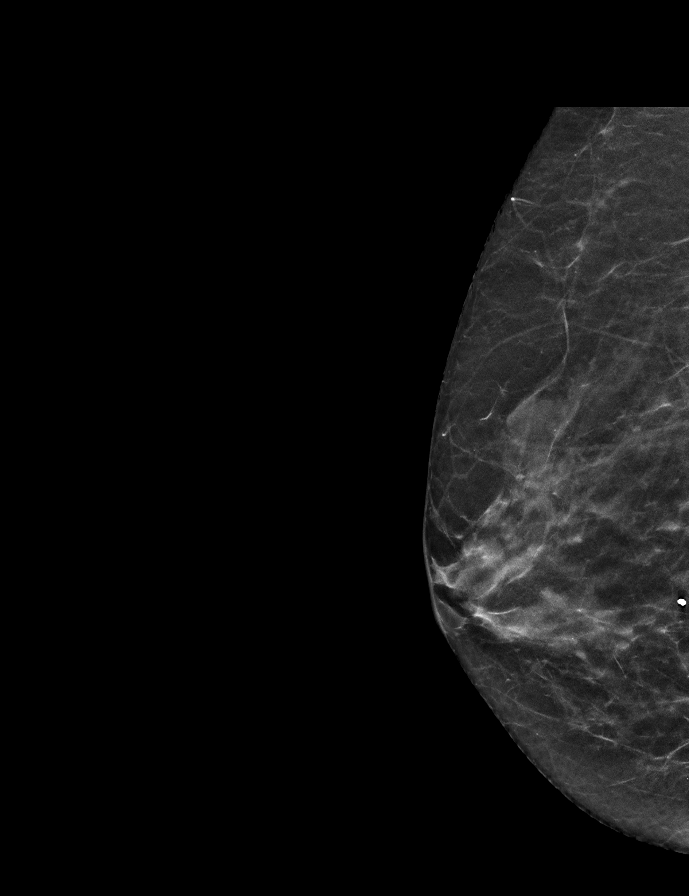

[R CC synth-2D]
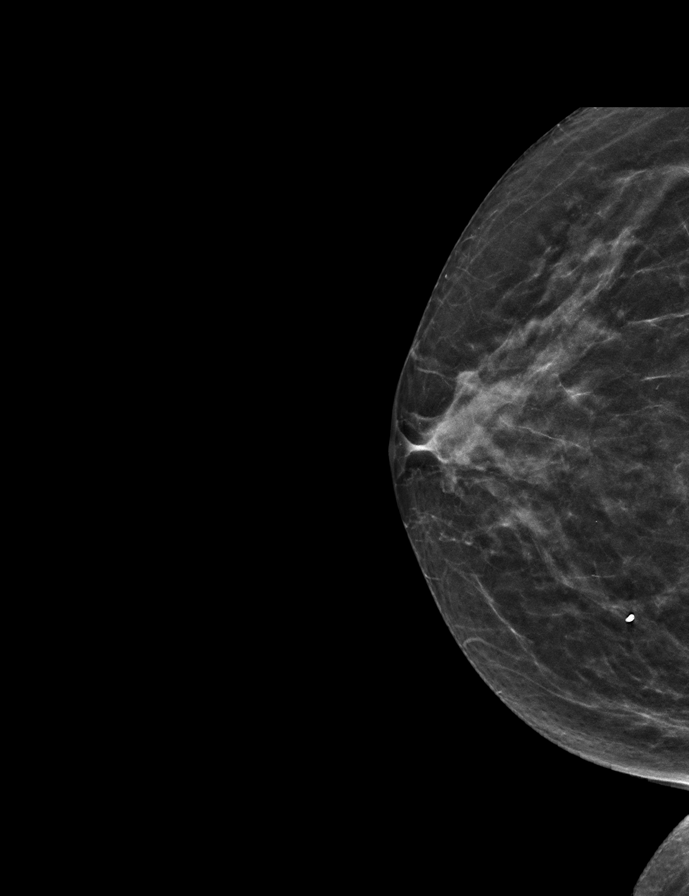

[L CC synth-2D]
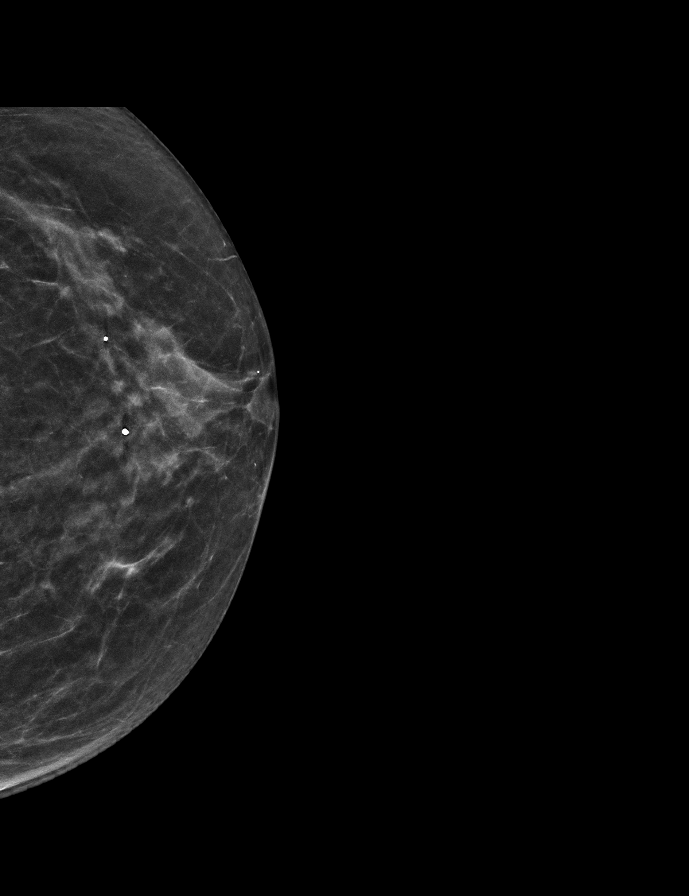

[L MLO synth-2D]
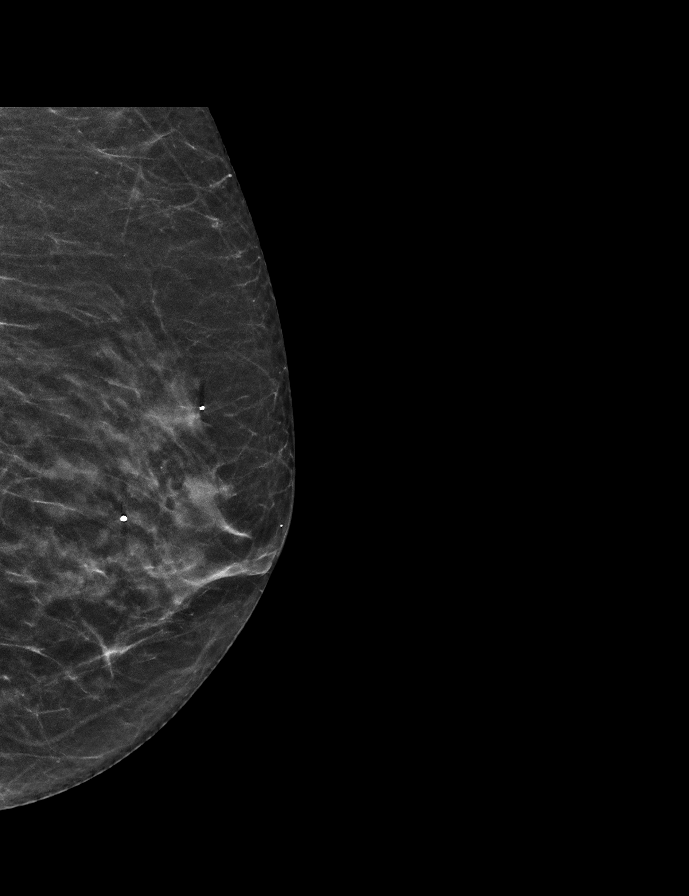

[R CCID BREAST TOMOSYNTHESIS IMAGE tomo · tomo slice 21/41.0]
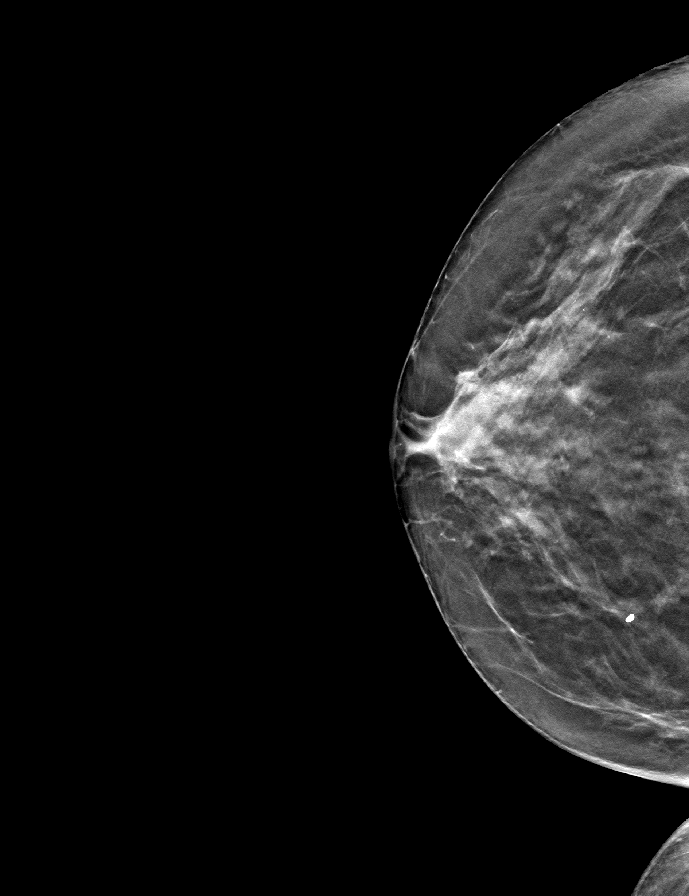

[9 of 28 positions shown; findings below may reference images not displayed]

ACR Breast Density Category c: The breast tissue is heterogeneously
dense, which may obscure small masses.
FINDINGS: The patient has retropectoral implants. There are no findings
suspicious for malignancy.
IMPRESSION: No mammographic evidence of malignancy. A result letter of this
screening mammogram will be mailed directly to the patient.

RECOMMENDATION:
Screening mammogram in one year. (Code:LT-E-7TH)

BI-RADS CATEGORY  1:  Negative.

## 2021-11-05 ENCOUNTER — Other Ambulatory Visit: Payer: Self-pay | Admitting: Family Medicine

## 2021-11-05 DIAGNOSIS — Z1231 Encounter for screening mammogram for malignant neoplasm of breast: Secondary | ICD-10-CM

## 2021-12-20 ENCOUNTER — Ambulatory Visit
Admission: RE | Admit: 2021-12-20 | Discharge: 2021-12-20 | Disposition: A | Discharge status: Home / Self Care | Payer: PRIVATE HEALTH INSURANCE | Source: Ambulatory Visit | Attending: Family Medicine | Admitting: Family Medicine

## 2021-12-20 DIAGNOSIS — Z1231 Encounter for screening mammogram for malignant neoplasm of breast: Secondary | ICD-10-CM

## 2023-01-12 ENCOUNTER — Encounter (HOSPITAL_BASED_OUTPATIENT_CLINIC_OR_DEPARTMENT_OTHER): Payer: Self-pay

## 2023-01-12 ENCOUNTER — Emergency Department (HOSPITAL_BASED_OUTPATIENT_CLINIC_OR_DEPARTMENT_OTHER)
Admission: EM | Admit: 2023-01-12 | Discharge: 2023-01-12 | Disposition: A | Discharge status: Home / Self Care | Payer: PRIVATE HEALTH INSURANCE | Attending: Emergency Medicine | Admitting: Emergency Medicine

## 2023-01-12 DIAGNOSIS — R739 Hyperglycemia, unspecified: Secondary | ICD-10-CM | POA: Diagnosis not present

## 2023-01-12 DIAGNOSIS — D72829 Elevated white blood cell count, unspecified: Secondary | ICD-10-CM | POA: Diagnosis not present

## 2023-01-12 DIAGNOSIS — L0390 Cellulitis, unspecified: Secondary | ICD-10-CM

## 2023-01-12 DIAGNOSIS — L03314 Cellulitis of groin: Secondary | ICD-10-CM | POA: Insufficient documentation

## 2023-01-12 LAB — CBC WITH DIFFERENTIAL/PLATELET
Abs Immature Granulocytes: 0.13 10*3/uL — ABNORMAL HIGH (ref 0.00–0.07)
Basophils Absolute: 0.1 10*3/uL (ref 0.0–0.1)
Basophils Relative: 0 %
Eosinophils Absolute: 0.1 10*3/uL (ref 0.0–0.5)
Eosinophils Relative: 1 %
HCT: 38 % (ref 36.0–46.0)
Hemoglobin: 12.7 g/dL (ref 12.0–15.0)
Immature Granulocytes: 1 %
Lymphocytes Relative: 9 %
Lymphs Abs: 2 10*3/uL (ref 0.7–4.0)
MCH: 28.5 pg (ref 26.0–34.0)
MCHC: 33.4 g/dL (ref 30.0–36.0)
MCV: 85.4 fL (ref 80.0–100.0)
Monocytes Absolute: 1.2 10*3/uL — ABNORMAL HIGH (ref 0.1–1.0)
Monocytes Relative: 5 %
Neutro Abs: 19.9 10*3/uL — ABNORMAL HIGH (ref 1.7–7.7)
Neutrophils Relative %: 84 %
Platelets: 288 10*3/uL (ref 150–400)
RBC: 4.45 MIL/uL (ref 3.87–5.11)
RDW: 13.3 % (ref 11.5–15.5)
WBC: 23.4 10*3/uL — ABNORMAL HIGH (ref 4.0–10.5)
nRBC: 0 % (ref 0.0–0.2)

## 2023-01-12 LAB — COMPREHENSIVE METABOLIC PANEL
ALT: 11 U/L (ref 0–44)
AST: 12 U/L — ABNORMAL LOW (ref 15–41)
Albumin: 4.2 g/dL (ref 3.5–5.0)
Alkaline Phosphatase: 42 U/L (ref 38–126)
Anion gap: 11 (ref 5–15)
BUN: 18 mg/dL (ref 8–23)
CO2: 26 mmol/L (ref 22–32)
Calcium: 9.3 mg/dL (ref 8.9–10.3)
Chloride: 101 mmol/L (ref 98–111)
Creatinine, Ser: 0.85 mg/dL (ref 0.44–1.00)
GFR, Estimated: >60 mL/min (ref >60)
Glucose, Bld: 112 mg/dL — ABNORMAL HIGH (ref 70–99)
Potassium: 4 mmol/L (ref 3.5–5.1)
Sodium: 138 mmol/L (ref 135–145)
Total Bilirubin: 0.9 mg/dL (ref 0.3–1.2)
Total Protein: 7.2 g/dL (ref 6.5–8.1)

## 2023-01-12 LAB — LACTIC ACID, PLASMA: Lactic Acid, Venous: 1.9 mmol/L (ref 0.5–1.9)

## 2023-01-12 MED ORDER — NAPROXEN 250 MG PO TABS
500.0000 mg | ORAL_TABLET | Freq: Once | ORAL | Status: AC
  Administered 2023-01-12: 500 mg via ORAL
  Filled 2023-01-12: qty 2

## 2023-01-12 MED ORDER — KETOROLAC TROMETHAMINE 15 MG/ML IJ SOLN: 15.0000 mg | Freq: Once | INTRAMUSCULAR | Status: DC

## 2023-01-12 MED ORDER — DEXTROSE 5 % IV SOLN
1500.0000 mg | Freq: Once | INTRAVENOUS | Status: AC
  Administered 2023-01-12: 1500 mg via INTRAVENOUS
  Filled 2023-01-12: qty 75

## 2023-01-12 MED ORDER — SODIUM CHLORIDE 0.9 % IV BOLUS
1000.0000 mL | Freq: Once | INTRAVENOUS | Status: AC
  Administered 2023-01-12: 1000 mL via INTRAVENOUS

## 2023-01-12 MED ORDER — ACETAMINOPHEN 325 MG PO TABS
650.0000 mg | ORAL_TABLET | Freq: Once | ORAL | Status: AC
  Administered 2023-01-12: 650 mg via ORAL
  Filled 2023-01-12: qty 2

## 2023-01-12 MED ORDER — ONDANSETRON HCL 4 MG/2ML IJ SOLN
4.0000 mg | Freq: Once | INTRAMUSCULAR | Status: AC
  Administered 2023-01-12: 4 mg via INTRAVENOUS
  Filled 2023-01-12: qty 2

## 2023-01-12 NOTE — ED Triage Notes (Signed)
She has an abscess at right groin at femoral fold area since yesterday. Is is edematous and erythematous. She was seen at an urgent care, where it was lanced. The periphery of the erythema has been marked with a skin marker.

## 2023-01-12 NOTE — Discharge Instructions (Addendum)
You were seen in the emergency department for concerns of an abscess. Based on evaluated an ultrasound imaging, there does not appear to be a clear abscess in your right groin and instead appears that there is cellulitis present. You were given a dose of Dalvance here in the ED given concerns for rate of spread of this infection. You may continue the doxycycline at home. Please return to the emergency department if the rash continues to spread rapidly or your symptoms are worsening.

## 2023-01-12 NOTE — Progress Notes (Signed)
Pharmacy Note: dalbavancin (DALVANCE) for Acute Bacterial Skin and Skin Structure Infection (ABSSSI) Patients to [facility] Discharge   Lauren Bowman is a 72 y.o. female who presented to [facility] on 01/12/2023 with an ABSSSI.  Inclusion criteria:  Indication: Cellulitis  Patient has at least one SIRS criteria present: WBC > 12,000 or < 4,000   Exclusion criteria: Patient was evaluated and negative for hardware involvement, hypotension / shock, elevated lactate > 2 without other explanation, gram-negative infection risk factors (bites, water exposure, infection after trauma, infection after skin graft, burns, severe immunocompromise), necrotizing fasciitis possible / confirmed, known or suspected osteomyelitis or septic arthritis, endocarditis, diabetic foot infection, ischemic ulcers, post-operative wound infection, perirectal infection, need for drainage in the OR, hand / facial infections, injection drug users with  fever, bacteremia, pregnancy or breastfeeding, allergy related to antibiotics like vancomycin, known liver disease ( T.Bili > 2x ULN or AST/ALT > 3x ULN).  Daylene Posey 01/12/2023, 4:13 PM Clinical Pharmacist

## 2023-01-12 NOTE — ED Provider Notes (Signed)
[facility] EMERGENCY DEPARTMENT AT [facility] Provider Note   CSN: [id number] Arrival date & time: 01/12/23  1323     History No chief complaint on file.   Lauren Bowman is a 72 y.o. female. Patient with past history significant for eczema and psoriasis presents to the ED with concerns of an abscess. She was evaluated at an urgent care earlier today where the area was lanced and some fluid drained, but no significant drainage observed. Concerned that she may have been bit by a chigger. She reports that she is concerned that the area has increased in size from yesterday when it was about a half-dollar size to what it currently is now. Reports some associated chills, and weakness but unsure if this is due to poor oral intake and pain.  HPI     Home Medications Prior to Admission medications   Medication Sig Start Date End Date Taking? Authorizing Provider  cetirizine (ZYRTEC) 10 MG tablet Take 10 mg by mouth daily.    [provider]  ezetimibe-simvastatin (VYTORIN) 10-10 MG tablet Take 1 tablet by mouth at bedtime.  07/20/14   [provider]  losartan (COZAAR) 25 MG tablet Take 25 mg by mouth daily.    [provider]  mineral oil-hydrophilic petrolatum (AQUAPHOR) ointment Apply 1 application topically as needed for dry skin.    [provider]  montelukast (SINGULAIR) 10 MG tablet Take 10 mg by mouth as needed.    [provider]  neomycin-polymyxin-hydrocortisone (CORTISPORIN) 3.5-10000-0.5 cream Apply topically 2 (two) times daily.    [provider]  Ustekinumab (STELARA) 45 MG/0.5ML SOLN Inject into the skin. EVERY 3-4 MONTHS    [provider]  atorvastatin (LIPITOR) 10 MG tablet Take 10 mg by mouth every morning.   11/28/11  [provider]      Allergies    Tape and Arlice Colt isothiocyanate]    Review of Systems   Review of Systems  Skin:  Positive for rash.  All other systems reviewed  and are negative.   Physical Exam Updated Vital Signs BP 109/61   Pulse 82   Temp 98.6 F (37 C) (Oral)   Resp 18   LMP 11/13/2006   SpO2 96%  Physical Exam Vitals and nursing note reviewed.  Constitutional:      General: She is not in acute distress.    Appearance: Normal appearance. She is normal weight.  HENT:     Head: Normocephalic and atraumatic.  Eyes:     General: No scleral icterus.    Conjunctiva/sclera: Conjunctivae normal.  Pulmonary:     Effort: No respiratory distress.  Skin:    General: Skin is warm and dry.     Findings: Erythema, lesion and rash present.     Comments: Area of erythema and drainage noted to right inguinal fold. Incision noted to the area from recent I&D attempt. Appears that the skin may be ulcerating from the suspected insect bite. No area of necrotic tissue visualized.  Neurological:     Mental Status: She is alert.     ED Results / Procedures / Treatments   Labs (all labs ordered are listed, but only abnormal results are displayed) Labs Reviewed  COMPREHENSIVE METABOLIC PANEL - Abnormal; Notable for the following components:      Result Value   Glucose, Bld 112 (*)    AST 12 (*)    All other components within normal limits  CBC WITH DIFFERENTIAL/PLATELET - Abnormal; Notable for  the following components:   WBC 23.4 (*)    Neutro Abs 19.9 (*)    Monocytes Absolute 1.2 (*)    Abs Immature Granulocytes 0.13 (*)    All other components within normal limits  LACTIC ACID, PLASMA    EKG None  Radiology No results found.  Procedures Procedures   Medications Ordered in ED Medications  ketorolac (TORADOL) 15 MG/ML injection 15 mg (15 mg Intravenous Not Given 01/12/23 1515)  naproxen (NAPROSYN) tablet 500 mg (500 mg Oral Given 01/12/23 1453)  acetaminophen (TYLENOL) tablet 650 mg (650 mg Oral Given 01/12/23 1509)  ondansetron (ZOFRAN) injection 4 mg (4 mg Intravenous Given 01/12/23 1509)  sodium chloride 0.9 % bolus 1,000 mL (0 mLs  Intravenous Stopped 01/12/23 1814)  dalbavancin (DALVANCE) 1,500 mg in dextrose 5 % 500 mL IVPB (0 mg Intravenous Stopped 01/12/23 1913)    ED Course/ Medical Decision Making/ A&P                           Medical Decision Making Amount and/or Complexity of Data Reviewed Labs: ordered.  Risk OTC drugs. Prescription drug management.   This patient presents to the ED for concern of abscess. Differential diagnosis includes insect bite, cellulitis, abscess, necrotizing fascitis   Lab Tests:  I Ordered, and personally interpreted labs.  The pertinent results include: CBC notable for significantly elevated white blood count of 23.4.  Neutrophilic predominance.  CMP with only mild hyperglycemia noted at 112.  Otherwise unremarkable.  Lactic acid borderline at 1.9.   Medicines ordered and prescription drug management:  I ordered medication including fluids, naproxen, Zofran, Tylenol, Dalvance for dehydration, fever, nausea, cellulitis Reevaluation of the patient after these medicines showed that the patient improved I have reviewed the patients home medicines and have made adjustments as needed   Problem List / ED Course:  Patient presented to the ED with concerns for an abscess. Located in femoral fold on right side. She believes that this is a bite from a chigger but states that she has previously not had severe reactions like this to a bite. Seen earlier today at urgent care who attempted to lance the site with only a small amount of drainage noted. Was started on doxycycline with only one dose taken so far. Line of erythema marked by urgent care earlier today but patient remarks that the erythema has been spreading since she first noted the area yesterday. Given concerning presentation, discussed case with Dr. Theresia Lo who used ultrasound to evaluate the wound with no notable areas of fluid pockets noted that would benefit from incision and drainage at this time. Instead, concerns for  cellulitis and rapidly progressing cellulitis are there given the size increase of the area over the course of the last 24 hours. CBC, CMP, and lactic ordered to assess patient condition. WBC significantly elevated at 23.4 but lactic is borderline elevated at 1.9. Informed patient and spouse of results and discussed treatment options at this time. Given concern for rapidly progressing cellulitis in combination with labs, advised patient that inpatient admission would be best route to take to ensure that adequate treatment is initiated and reduce the risk of complications. Patient would prefer to avoid admission as she states that she has things at home to take care of today but would be fine being admitted tomorrow if needed. In the interim, agreed to a dose of Dalvance here in the ED and continuation of doxycycline at home. Dalvance administered in  the ED without any adverse reactions. Patient reports some symptomatic improvement with Dalvance and fluids. Will plan on discharge but advised strict return precautions given concerning spread of cellulitis in combination with WBC elevation. Patient agreeable with treatment plan and verbalized understanding all return precautions.   Final Clinical Impression(s) / ED Diagnoses Final diagnoses:  Cellulitis of groin  Leukocytosis, unspecified type    Rx / DC Orders ED Discharge Orders          Ordered    Ambulatory referral to Infectious Disease       Comments: Cellulitis patient:  Received dalbavancin on 01/12/2023.   01/12/23 1617    Apply dressing  Status:  Canceled        01/12/23 1849              Smitty Knudsen, PA-C 01/13/23 0004    Rexford Maus, DO 01/15/23 0701

## 2023-03-14 ENCOUNTER — Other Ambulatory Visit: Payer: Self-pay | Admitting: Family Medicine

## 2023-03-14 DIAGNOSIS — Z1231 Encounter for screening mammogram for malignant neoplasm of breast: Secondary | ICD-10-CM

## 2023-04-08 ENCOUNTER — Ambulatory Visit
Admission: RE | Admit: 2023-04-08 | Discharge: 2023-04-08 | Disposition: A | Discharge status: Home / Self Care | Payer: PRIVATE HEALTH INSURANCE | Source: Ambulatory Visit | Attending: Family Medicine | Admitting: Family Medicine

## 2023-04-08 DIAGNOSIS — Z1231 Encounter for screening mammogram for malignant neoplasm of breast: Secondary | ICD-10-CM

## 2024-04-19 ENCOUNTER — Other Ambulatory Visit: Payer: Self-pay | Admitting: Family Medicine

## 2024-04-19 DIAGNOSIS — Z1231 Encounter for screening mammogram for malignant neoplasm of breast: Secondary | ICD-10-CM

## 2024-04-26 ENCOUNTER — Other Ambulatory Visit: Payer: Self-pay | Admitting: Medical Genetics

## 2024-05-03 ENCOUNTER — Ambulatory Visit
Admission: RE | Admit: 2024-05-03 | Discharge: 2024-05-03 | Disposition: A | Discharge status: Home / Self Care | Payer: PRIVATE HEALTH INSURANCE | Source: Ambulatory Visit | Attending: Family Medicine | Admitting: Family Medicine

## 2024-05-03 DIAGNOSIS — Z1231 Encounter for screening mammogram for malignant neoplasm of breast: Secondary | ICD-10-CM

## 2024-07-09 ENCOUNTER — Other Ambulatory Visit: Payer: Self-pay | Admitting: Medical Genetics

## 2024-07-09 DIAGNOSIS — Z006 Encounter for examination for normal comparison and control in clinical research program: Secondary | ICD-10-CM

## 2024-08-16 LAB — GENECONNECT MOLECULAR SCREEN: Genetic Analysis Overall Interpretation: NEGATIVE
